# Patient Record
Sex: Female | Born: 1950 | Race: White | Hispanic: No | Smoking: Former smoker
Health system: Southern US, Community
[De-identification: ages and names within clinical notes are randomized; demographics above are authoritative.]

## PROBLEM LIST (undated history)

## (undated) DIAGNOSIS — F329 Major depressive disorder, single episode, unspecified: Secondary | ICD-10-CM

## (undated) DIAGNOSIS — R079 Chest pain, unspecified: Secondary | ICD-10-CM

## (undated) DIAGNOSIS — F32A Depression, unspecified: Secondary | ICD-10-CM

## (undated) DIAGNOSIS — J329 Chronic sinusitis, unspecified: Secondary | ICD-10-CM

## (undated) DIAGNOSIS — Z87442 Personal history of urinary calculi: Secondary | ICD-10-CM

## (undated) DIAGNOSIS — R519 Headache, unspecified: Secondary | ICD-10-CM

## (undated) DIAGNOSIS — C801 Malignant (primary) neoplasm, unspecified: Secondary | ICD-10-CM

## (undated) HISTORY — PX: BREAST BIOPSY: SHX20

## (undated) HISTORY — PX: ROTATOR CUFF REPAIR: SHX139

## (undated) HISTORY — DX: Chest pain, unspecified: R07.9

## (undated) HISTORY — PX: KNEE SURGERY: SHX244

---

## 2009-04-30 ENCOUNTER — Encounter: Admission: RE | Admit: 2009-04-30 | Discharge: 2009-04-30 | Payer: Self-pay | Admitting: Obstetrics and Gynecology

## 2013-03-14 ENCOUNTER — Ambulatory Visit: Payer: Self-pay | Admitting: Neurology

## 2016-04-30 DIAGNOSIS — J0141 Acute recurrent pansinusitis: Secondary | ICD-10-CM | POA: Diagnosis not present

## 2016-05-06 DIAGNOSIS — N760 Acute vaginitis: Secondary | ICD-10-CM | POA: Diagnosis not present

## 2016-05-06 DIAGNOSIS — R011 Cardiac murmur, unspecified: Secondary | ICD-10-CM | POA: Diagnosis not present

## 2016-05-06 DIAGNOSIS — J018 Other acute sinusitis: Secondary | ICD-10-CM | POA: Diagnosis not present

## 2016-05-12 DIAGNOSIS — R011 Cardiac murmur, unspecified: Secondary | ICD-10-CM | POA: Diagnosis not present

## 2016-05-17 ENCOUNTER — Encounter (HOSPITAL_COMMUNITY): Payer: Self-pay | Admitting: Emergency Medicine

## 2016-05-17 ENCOUNTER — Emergency Department (HOSPITAL_COMMUNITY)
Admission: EM | Admit: 2016-05-17 | Discharge: 2016-05-18 | Disposition: A | Discharge status: Home / Self Care | Payer: PPO | Attending: Emergency Medicine | Admitting: Emergency Medicine

## 2016-05-17 DIAGNOSIS — R1032 Left lower quadrant pain: Secondary | ICD-10-CM | POA: Insufficient documentation

## 2016-05-17 DIAGNOSIS — R1013 Epigastric pain: Secondary | ICD-10-CM | POA: Insufficient documentation

## 2016-05-17 HISTORY — DX: Chronic sinusitis, unspecified: J32.9

## 2016-05-17 HISTORY — DX: Malignant (primary) neoplasm, unspecified: C80.1

## 2016-05-17 HISTORY — DX: Depression, unspecified: F32.A

## 2016-05-17 HISTORY — DX: Major depressive disorder, single episode, unspecified: F32.9

## 2016-05-17 LAB — URINALYSIS, ROUTINE W REFLEX MICROSCOPIC
Bilirubin Urine: NEGATIVE
Glucose, UA: NEGATIVE mg/dL
Ketones, ur: NEGATIVE mg/dL
Leukocytes, UA: NEGATIVE
Nitrite: NEGATIVE
Protein, ur: NEGATIVE mg/dL
Specific Gravity, Urine: 1.024 (ref 1.005–1.030)
pH: 5 (ref 5.0–8.0)

## 2016-05-17 LAB — COMPREHENSIVE METABOLIC PANEL
ALT: 18 U/L (ref 14–54)
AST: 26 U/L (ref 15–41)
Albumin: 4.4 g/dL (ref 3.5–5.0)
Alkaline Phosphatase: 50 U/L (ref 38–126)
Anion gap: 10 (ref 5–15)
BUN: 15 mg/dL (ref 6–20)
CO2: 23 mmol/L (ref 22–32)
Calcium: 9.1 mg/dL (ref 8.9–10.3)
Chloride: 104 mmol/L (ref 101–111)
Creatinine, Ser: 1.02 mg/dL — ABNORMAL HIGH (ref 0.44–1.00)
GFR calc Af Amer: >60 mL/min (ref >60)
GFR calc non Af Amer: 56 mL/min — ABNORMAL LOW (ref >60)
Glucose, Bld: 142 mg/dL — ABNORMAL HIGH (ref 65–99)
Potassium: 4.7 mmol/L (ref 3.5–5.1)
Sodium: 137 mmol/L (ref 135–145)
Total Bilirubin: 0.6 mg/dL (ref 0.3–1.2)
Total Protein: 6.8 g/dL (ref 6.5–8.1)

## 2016-05-17 LAB — URINE MICROSCOPIC-ADD ON: Bacteria, UA: NONE SEEN

## 2016-05-17 LAB — CBC
HCT: 42.7 % (ref 36.0–46.0)
Hemoglobin: 14 g/dL (ref 12.0–15.0)
MCH: 31.5 pg (ref 26.0–34.0)
MCHC: 32.8 g/dL (ref 30.0–36.0)
MCV: 96 fL (ref 78.0–100.0)
Platelets: 218 10*3/uL (ref 150–400)
RBC: 4.45 MIL/uL (ref 3.87–5.11)
RDW: 12.6 % (ref 11.5–15.5)
WBC: 7.2 10*3/uL (ref 4.0–10.5)

## 2016-05-17 LAB — LIPASE, BLOOD: Lipase: 32 U/L (ref 11–51)

## 2016-05-17 MED ORDER — MORPHINE SULFATE (PF) 4 MG/ML IV SOLN
4.0000 mg | Freq: Once | INTRAVENOUS | Status: AC
  Administered 2016-05-18: 4 mg via INTRAVENOUS
  Filled 2016-05-17: qty 1

## 2016-05-17 MED ORDER — ONDANSETRON HCL 4 MG/2ML IJ SOLN
4.0000 mg | Freq: Once | INTRAMUSCULAR | Status: AC
  Administered 2016-05-18: 4 mg via INTRAVENOUS
  Filled 2016-05-17: qty 2

## 2016-05-17 MED ORDER — IOPAMIDOL (ISOVUE-300) INJECTION 61%
INTRAVENOUS | Status: AC
  Administered 2016-05-18: 100 mL
  Filled 2016-05-17: qty 100

## 2016-05-17 MED ORDER — ONDANSETRON 4 MG PO TBDP
4.0000 mg | ORAL_TABLET | Freq: Once | ORAL | Status: AC | PRN
  Administered 2016-05-17: 4 mg via ORAL

## 2016-05-17 MED ORDER — ONDANSETRON 4 MG PO TBDP
ORAL_TABLET | ORAL | Status: AC
  Filled 2016-05-17: qty 1

## 2016-05-17 NOTE — ED Notes (Signed)
EDP at bedside  

## 2016-05-17 NOTE — ED Triage Notes (Signed)
Pt. reports LLQ pain with emesis , chills and fatigue onset this evening , denies diarrhea or fever .

## 2016-05-18 ENCOUNTER — Encounter (HOSPITAL_COMMUNITY): Payer: Self-pay | Admitting: Radiology

## 2016-05-18 ENCOUNTER — Emergency Department (HOSPITAL_COMMUNITY): Payer: PPO

## 2016-05-18 DIAGNOSIS — R1032 Left lower quadrant pain: Secondary | ICD-10-CM | POA: Diagnosis not present

## 2016-05-18 MED ORDER — HYDROCODONE-ACETAMINOPHEN 5-325 MG PO TABS
1.0000 | ORAL_TABLET | Freq: Once | ORAL | Status: AC
  Administered 2016-05-18: 1 via ORAL
  Filled 2016-05-18: qty 1

## 2016-05-18 MED ORDER — ONDANSETRON 4 MG PO TBDP: ORAL_TABLET | ORAL | 0 refills | Status: DC

## 2016-05-18 MED ORDER — HYDROCODONE-ACETAMINOPHEN 5-325 MG PO TABS: 1.0000 | ORAL_TABLET | Freq: Four times a day (QID) | ORAL | 0 refills | Status: DC | PRN

## 2016-05-18 NOTE — ED Notes (Signed)
Pt back from CT

## 2016-05-18 NOTE — ED Notes (Signed)
EDP at bedside  

## 2016-05-18 NOTE — ED Provider Notes (Signed)
[facility] DEPT Provider Note   CSN: [id number] Arrival date & time: 05/17/16  2054     History   Chief Complaint Chief Complaint  Patient presents with  . Abdominal Pain  . Emesis    HPI Mindy Mann is a 65 y.o. female with a hx of depression, sinusitis presents to the Emergency Department complaining of gradual, persistent, rapidly progressively worsening Left lower quadrant abdominal pain onset 7 PM tonight. Associated symptoms include nausea and multiple episodes of nonbloody, nonbilious emesis. No treatments prior to arrival. No aggravating or alleviating factors. Patient also reports associated midline lower back pain. Patient denies fever, chills, diarrhea, numbness, tingling, weakness, loss of bowel or bladder control. She denies recent falls, anticoagulants or IV drug use. Patient denies recent travel.  The history is provided by the patient and medical records. No language interpreter was used.    Past Medical History:  Diagnosis Date  . Cancer ([place])   . Depression   . Sinusitis     There are no active problems to display for this patient.   Past Surgical History:  Procedure Laterality Date  . KNEE SURGERY    . ROTATOR CUFF REPAIR      OB History    No data available       Home Medications    Prior to Admission medications   Medication Sig Start Date End Date Taking? Authorizing Provider  HYDROcodone-acetaminophen (NORCO/VICODIN) 5-325 MG tablet Take 1-2 tablets by mouth every 6 (six) hours as needed for moderate pain or severe pain. 05/18/16   Joan Avetisyan, PA-C  ondansetron (ZOFRAN ODT) 4 MG disintegrating tablet 4mg  ODT q4 hours prn nausea/vomit 05/18/16   Jarrett Soho Lethia Donlon, PA-C    Family History No family history on file.  Social History Social History  Substance Use Topics  . Smoking status: Never Smoker  . Smokeless tobacco: Never Used  . Alcohol use Yes     Allergies   Patient has no known allergies.   Review of  Systems Review of Systems  Constitutional: Positive for diaphoresis ( resolved). Negative for fever.  Gastrointestinal: Positive for abdominal pain ( LLQ), nausea and vomiting.  Musculoskeletal: Positive for back pain ( low, midline). Negative for arthralgias and neck pain.  All other systems reviewed and are negative.    Physical Exam Updated Vital Signs BP 140/78   Pulse 69   Temp 97.6 F (36.4 C) (Rectal)   Resp 16   Ht 5' 4.25" (1.632 m)   Wt 56.2 kg   SpO2 99%   BMI 21.12 kg/m   Physical Exam  Constitutional: She appears well-developed and well-nourished. No distress.  Awake, alert, nontoxic appearance  HENT:  Head: Normocephalic and atraumatic.  Mouth/Throat: Oropharynx is clear and moist. No oropharyngeal exudate.  Eyes: Conjunctivae are normal. No scleral icterus.  Neck: Normal range of motion. Neck supple.  Full ROM without pain  Cardiovascular: Normal rate, regular rhythm and intact distal pulses.   Pulmonary/Chest: Effort normal and breath sounds normal. No respiratory distress. She has no wheezes.  Equal chest expansion  Abdominal: Soft. Bowel sounds are normal. She exhibits no distension and no mass. There is tenderness in the epigastric area and left lower quadrant. There is no rebound and no guarding.  Musculoskeletal: Normal range of motion. She exhibits no edema.  Full range of motion of the T-spine and L-spine No midline tenderness to the  T-spine or L-spine No Tenderness to palpation of the paraspinous muscles of the L-spine  Lymphadenopathy:  She has no cervical adenopathy.  Neurological: She is alert.  Speech is clear and goal oriented, follows commands Normal 5/5 strength in upper and lower extremities bilaterally including dorsiflexion and plantar flexion, strong and equal grip strength Sensation normal to light touch Moves extremities without ataxia, coordination intact Normal gait Normal balance No Clonus  Skin: Skin is warm and dry. No rash  noted. She is not diaphoretic. No erythema.  Psychiatric: She has a normal mood and affect. Her behavior is normal.  Nursing note and vitals reviewed.    ED Treatments / Results  Labs (all labs ordered are listed, but only abnormal results are displayed) Labs Reviewed  COMPREHENSIVE METABOLIC PANEL - Abnormal; Notable for the following:       Result Value   Glucose, Bld 142 (*)    Creatinine, Ser 1.02 (*)    GFR calc non Af Amer 56 (*)    All other components within normal limits  URINALYSIS, ROUTINE W REFLEX MICROSCOPIC (NOT AT [facility]) - Abnormal; Notable for the following:    APPearance HAZY (*)    Hgb urine dipstick LARGE (*)    All other components within normal limits  URINE MICROSCOPIC-ADD ON - Abnormal; Notable for the following:    Squamous Epithelial / LPF 0-5 (*)    Crystals CA OXALATE CRYSTALS (*)    All other components within normal limits  LIPASE, BLOOD  CBC     Radiology Ct Abdomen Pelvis W Contrast  Result Date: 05/18/2016 CLINICAL DATA:  Left lower quadrant abdominal pain and low back pain for 1 day. Nausea and vomiting. EXAM: CT ABDOMEN AND PELVIS WITH CONTRAST TECHNIQUE: Multidetector CT imaging of the abdomen and pelvis was performed using the standard protocol following bolus administration of intravenous contrast. CONTRAST:  100 ml ISOVUE-300 IOPAMIDOL (ISOVUE-300) INJECTION 61% COMPARISON:  None. FINDINGS: Lower chest: The lung bases are clear. Hepatobiliary: No focal liver abnormality is seen. No gallstones, gallbladder wall thickening, or biliary dilatation. Pancreas: Unremarkable. No pancreatic ductal dilatation or surrounding inflammatory changes. Spleen: Normal in size without focal abnormality. Adrenals/Urinary Tract: No adrenal gland nodules. Nonobstructing stone in the midpole left kidney. Infiltration or edema around the lower pole left kidney and around the proximal ureter with ureteral wall thickening. This may indicate pyelonephritis. No obstructing  stones are demonstrated but changes could reflect sequela of a recently passed stone. Renal nephrograms are symmetrical. No hydronephrosis or hydroureter. Bladder wall is not thickened. Stomach/Bowel: Stomach is mostly decompressed. Small bowel are decompressed. Diffusely stool-filled colon. No wall thickening or inflammatory changes appreciated. Appendix is not identified. Vascular/Lymphatic: No significant vascular findings are present. No enlarged abdominal or pelvic lymph nodes. Reproductive: Calcifications in the uterus consistent with fibroids. No abnormal adnexal masses. Uterus is atrophic. Other: No abdominal wall hernia or abnormality. No abdominopelvic ascites. Musculoskeletal: Degenerative changes in the spine. No destructive bone lesions. IMPRESSION: No evidence of bowel obstruction or inflammation. Diffusely stool-filled colon. Nonobstructing stone in the left kidney. There is infiltration or edema around the left kidney and proximal ureter. Mild ureteral wall thickening. This may indicate pyelonephritis or sequela of a recently passed stone. Electronically Signed   By: Lucienne Capers M.D.   On: 05/18/2016 00:54    Procedures Procedures (including critical care time)  EMERGENCY DEPARTMENT [place] ABD/AORTA EXAM Study: Limited Ultrasound of the Abdominal Aorta.  INDICATIONS:Abdominal pain Indication: Multiple views of the abdominal aorta are obtained from the diaphragmatic hiatus to the aortic bifurcation in transverse and sagittal planes with a multi- Frequency probe.  PERFORMED BY: Myself  IMAGES ARCHIVED?: Yes  FINDINGS: Maximum aortic dimensions are 2.03  LIMITATIONS:  Bowel gas  INTERPRETATION:  No abdominal aortic aneurysm  COMMENT:  No free fluid, no AAA    EMERGENCY DEPARTMENT US RENAL EXAM  "Study: Limited Retroperitoneal Ultrasound of Kidneys"  INDICATIONS: Back pain  Long and short axis of both kidneys were obtained.   PERFORMED BY: Myself  IMAGES ARCHIVED?:  Yes  LIMITATIONS: Body habitus  VIEWS USED: Long axis   INTERPRETATION: No Hydronephrosis   CPT Code: [phone] (limited retroperitoneal)    Medications Ordered in ED Medications  ondansetron (ZOFRAN-ODT) disintegrating tablet 4 mg (4 mg Oral Given 05/17/16 2144)  morphine 4 MG/ML injection 4 mg (4 mg Intravenous Given 05/18/16 0002)  ondansetron (ZOFRAN) injection 4 mg (4 mg Intravenous Given 05/18/16 0002)  iopamidol (ISOVUE-300) 61 % injection (100 mLs  Contrast Given 05/18/16 0018)  HYDROcodone-acetaminophen (NORCO/VICODIN) 5-325 MG per tablet 1 tablet (1 tablet Oral Given 05/18/16 0134)     Initial Impression / Assessment and Plan / ED Course  I have reviewed the triage vital signs and the nursing notes.  Pertinent labs & imaging results that were available during my care of the patient were reviewed by me and considered in my medical decision making (see chart for details).  Clinical Course as of May 18 204  Tue May 18, 2016  0203 Crystals but no signs of infection. Crystals: (!) CA OXALATE CRYSTALS [HM]  0203 Normal WBC: 7.2 [HM]  0203 Normal Lipase: 32 [HM]  0203 Infiltration or edema around the left kidney and proximal ureter with mild ureteral wall thickening.  No evidence of pyelonephritis based on clinical exam and urinalysis. Suspect recently passed stone. Patient's pain has improved significantly since arrival in the department.   CT ABDOMEN PELVIS W CONTRAST [HM]  0204 Patient is tolerating by mouth without difficulty. Pain is controlled. No persistent vomiting. No fevers or chills. Vital signs stable.  [HM]    Clinical Course User Index [HM] Abigail Butts, PA-C    Patient with left lower quadrant abdominal pain. Low back pain but no CVA tenderness. No evidence of urinary tract infection on UA. Patient with minimal hemoglobin and calcium oxalate crystals. Suspect possible ureteral stone/renal colic.   CT scan with evidence of pyelonephritis versus  recently passed ureteral stone. There is no clinical or laboratory evidence of pyelonephritis. She is afebrile and well-appearing. She is now tolerating by mouth without difficulty. Suspect patient recently passed a ureteral stone. She has a persistent stone in her left kidney though I doubt this the source of her pain.   Findings discussed. Discussed planned for discharge home and close primary care follow-up. Patient states understanding and is agreeable to plan. Her pain is well controlled at this time.   BP 124/75   Pulse 81   Temp 97.6 F (36.4 C) (Rectal)   Resp 18   Ht 5' 4.25" (1.632 m)   Wt 56.2 kg   SpO2 96%   BMI 21.12 kg/m   Final Clinical Impressions(s) / ED Diagnoses   Final diagnoses:  LLQ abdominal pain    New Prescriptions New Prescriptions   HYDROCODONE-ACETAMINOPHEN (NORCO/VICODIN) 5-325 MG TABLET    Take 1-2 tablets by mouth every 6 (six) hours as needed for moderate pain or severe pain.   ONDANSETRON (ZOFRAN ODT) 4 MG DISINTEGRATING TABLET    4mg  ODT q4 hours prn nausea/vomit     Abigail Butts, PA-C 05/18/16 0207    Merryl Hacker,  MD 05/18/16 [id number]

## 2016-05-18 NOTE — Discharge Instructions (Signed)
1. Medications: zofran, vicodin, usual home medications 2. Treatment: rest, drink plenty of fluids, advance diet slowly 3. Follow Up: Please followup with your primary doctor in 2-3 days for discussion of your diagnoses and further evaluation after today's visit; if you do not have a primary care doctor use the resource guide provided to find one; Please return to the ER for persistent vomiting, high fevers or worsening symptoms

## 2016-05-18 NOTE — ED Notes (Signed)
Pt given Coke for PO challenge, currently tolerating well. Will continue to monitor

## 2016-05-18 NOTE — ED Notes (Signed)
Patient transported to CT 

## 2016-05-20 DIAGNOSIS — N390 Urinary tract infection, site not specified: Secondary | ICD-10-CM | POA: Diagnosis not present

## 2016-05-20 DIAGNOSIS — N200 Calculus of kidney: Secondary | ICD-10-CM | POA: Diagnosis not present

## 2016-05-26 DIAGNOSIS — I071 Rheumatic tricuspid insufficiency: Secondary | ICD-10-CM | POA: Diagnosis not present

## 2016-05-26 DIAGNOSIS — R011 Cardiac murmur, unspecified: Secondary | ICD-10-CM | POA: Diagnosis not present

## 2016-05-26 DIAGNOSIS — I340 Nonrheumatic mitral (valve) insufficiency: Secondary | ICD-10-CM | POA: Diagnosis not present

## 2016-05-26 DIAGNOSIS — I10 Essential (primary) hypertension: Secondary | ICD-10-CM | POA: Diagnosis not present

## 2016-08-17 DIAGNOSIS — L821 Other seborrheic keratosis: Secondary | ICD-10-CM | POA: Diagnosis not present

## 2016-08-17 DIAGNOSIS — D1800 Hemangioma unspecified site: Secondary | ICD-10-CM | POA: Diagnosis not present

## 2016-08-17 DIAGNOSIS — Z23 Encounter for immunization: Secondary | ICD-10-CM | POA: Diagnosis not present

## 2016-08-17 DIAGNOSIS — D225 Melanocytic nevi of trunk: Secondary | ICD-10-CM | POA: Diagnosis not present

## 2016-08-17 DIAGNOSIS — Z808 Family history of malignant neoplasm of other organs or systems: Secondary | ICD-10-CM | POA: Diagnosis not present

## 2016-08-17 DIAGNOSIS — Z85828 Personal history of other malignant neoplasm of skin: Secondary | ICD-10-CM | POA: Diagnosis not present

## 2016-08-17 DIAGNOSIS — D2271 Melanocytic nevi of right lower limb, including hip: Secondary | ICD-10-CM | POA: Diagnosis not present

## 2016-08-17 DIAGNOSIS — L814 Other melanin hyperpigmentation: Secondary | ICD-10-CM | POA: Diagnosis not present

## 2016-09-29 DIAGNOSIS — Z1231 Encounter for screening mammogram for malignant neoplasm of breast: Secondary | ICD-10-CM | POA: Diagnosis not present

## 2016-09-29 DIAGNOSIS — N952 Postmenopausal atrophic vaginitis: Secondary | ICD-10-CM | POA: Diagnosis not present

## 2016-09-29 DIAGNOSIS — Z6821 Body mass index (BMI) 21.0-21.9, adult: Secondary | ICD-10-CM | POA: Diagnosis not present

## 2016-09-29 DIAGNOSIS — R350 Frequency of micturition: Secondary | ICD-10-CM | POA: Diagnosis not present

## 2016-09-29 DIAGNOSIS — Z01419 Encounter for gynecological examination (general) (routine) without abnormal findings: Secondary | ICD-10-CM | POA: Diagnosis not present

## 2016-09-29 DIAGNOSIS — R319 Hematuria, unspecified: Secondary | ICD-10-CM | POA: Diagnosis not present

## 2016-09-29 DIAGNOSIS — Z124 Encounter for screening for malignant neoplasm of cervix: Secondary | ICD-10-CM | POA: Diagnosis not present

## 2016-10-12 DIAGNOSIS — N870 Mild cervical dysplasia: Secondary | ICD-10-CM | POA: Diagnosis not present

## 2016-10-12 DIAGNOSIS — R87612 Low grade squamous intraepithelial lesion on cytologic smear of cervix (LGSIL): Secondary | ICD-10-CM | POA: Diagnosis not present

## 2016-10-19 DIAGNOSIS — R319 Hematuria, unspecified: Secondary | ICD-10-CM | POA: Diagnosis not present

## 2016-11-02 DIAGNOSIS — N888 Other specified noninflammatory disorders of cervix uteri: Secondary | ICD-10-CM | POA: Diagnosis not present

## 2016-11-02 DIAGNOSIS — R87610 Atypical squamous cells of undetermined significance on cytologic smear of cervix (ASC-US): Secondary | ICD-10-CM | POA: Diagnosis not present

## 2017-03-23 DIAGNOSIS — H2513 Age-related nuclear cataract, bilateral: Secondary | ICD-10-CM | POA: Diagnosis not present

## 2017-03-23 DIAGNOSIS — H524 Presbyopia: Secondary | ICD-10-CM | POA: Diagnosis not present

## 2017-03-23 DIAGNOSIS — H52203 Unspecified astigmatism, bilateral: Secondary | ICD-10-CM | POA: Diagnosis not present

## 2017-03-23 DIAGNOSIS — H04123 Dry eye syndrome of bilateral lacrimal glands: Secondary | ICD-10-CM | POA: Diagnosis not present

## 2017-03-23 DIAGNOSIS — H43391 Other vitreous opacities, right eye: Secondary | ICD-10-CM | POA: Diagnosis not present

## 2017-04-25 DIAGNOSIS — Z779 Other contact with and (suspected) exposures hazardous to health: Secondary | ICD-10-CM | POA: Diagnosis not present

## 2017-04-25 DIAGNOSIS — N390 Urinary tract infection, site not specified: Secondary | ICD-10-CM | POA: Diagnosis not present

## 2017-04-25 DIAGNOSIS — N879 Dysplasia of cervix uteri, unspecified: Secondary | ICD-10-CM | POA: Diagnosis not present

## 2017-04-25 DIAGNOSIS — R351 Nocturia: Secondary | ICD-10-CM | POA: Diagnosis not present

## 2017-05-11 DIAGNOSIS — D259 Leiomyoma of uterus, unspecified: Secondary | ICD-10-CM | POA: Diagnosis not present

## 2017-07-20 DIAGNOSIS — J018 Other acute sinusitis: Secondary | ICD-10-CM | POA: Diagnosis not present

## 2017-08-23 DIAGNOSIS — D692 Other nonthrombocytopenic purpura: Secondary | ICD-10-CM | POA: Diagnosis not present

## 2017-08-23 DIAGNOSIS — D2271 Melanocytic nevi of right lower limb, including hip: Secondary | ICD-10-CM | POA: Diagnosis not present

## 2017-08-23 DIAGNOSIS — L814 Other melanin hyperpigmentation: Secondary | ICD-10-CM | POA: Diagnosis not present

## 2017-08-23 DIAGNOSIS — D1800 Hemangioma unspecified site: Secondary | ICD-10-CM | POA: Diagnosis not present

## 2017-08-23 DIAGNOSIS — Z808 Family history of malignant neoplasm of other organs or systems: Secondary | ICD-10-CM | POA: Diagnosis not present

## 2017-08-23 DIAGNOSIS — D225 Melanocytic nevi of trunk: Secondary | ICD-10-CM | POA: Diagnosis not present

## 2017-08-23 DIAGNOSIS — Z85828 Personal history of other malignant neoplasm of skin: Secondary | ICD-10-CM | POA: Diagnosis not present

## 2017-08-23 DIAGNOSIS — Z23 Encounter for immunization: Secondary | ICD-10-CM | POA: Diagnosis not present

## 2017-08-23 DIAGNOSIS — L821 Other seborrheic keratosis: Secondary | ICD-10-CM | POA: Diagnosis not present

## 2017-10-01 DIAGNOSIS — M545 Low back pain: Secondary | ICD-10-CM | POA: Diagnosis not present

## 2017-10-01 DIAGNOSIS — M62830 Muscle spasm of back: Secondary | ICD-10-CM | POA: Diagnosis not present

## 2017-11-23 DIAGNOSIS — Z6827 Body mass index (BMI) 27.0-27.9, adult: Secondary | ICD-10-CM | POA: Diagnosis not present

## 2017-11-23 DIAGNOSIS — Z124 Encounter for screening for malignant neoplasm of cervix: Secondary | ICD-10-CM | POA: Diagnosis not present

## 2017-11-23 DIAGNOSIS — Z1231 Encounter for screening mammogram for malignant neoplasm of breast: Secondary | ICD-10-CM | POA: Diagnosis not present

## 2017-11-23 DIAGNOSIS — N3945 Continuous leakage: Secondary | ICD-10-CM | POA: Diagnosis not present

## 2017-11-23 DIAGNOSIS — Z779 Other contact with and (suspected) exposures hazardous to health: Secondary | ICD-10-CM | POA: Diagnosis not present

## 2017-11-23 DIAGNOSIS — M810 Age-related osteoporosis without current pathological fracture: Secondary | ICD-10-CM | POA: Diagnosis not present

## 2017-11-23 DIAGNOSIS — Z780 Asymptomatic menopausal state: Secondary | ICD-10-CM | POA: Diagnosis not present

## 2017-11-23 DIAGNOSIS — Z01419 Encounter for gynecological examination (general) (routine) without abnormal findings: Secondary | ICD-10-CM | POA: Diagnosis not present

## 2017-11-23 DIAGNOSIS — N958 Other specified menopausal and perimenopausal disorders: Secondary | ICD-10-CM | POA: Diagnosis not present

## 2018-01-30 DIAGNOSIS — Z23 Encounter for immunization: Secondary | ICD-10-CM | POA: Diagnosis not present

## 2018-01-30 DIAGNOSIS — Z13220 Encounter for screening for lipoid disorders: Secondary | ICD-10-CM | POA: Diagnosis not present

## 2018-01-30 DIAGNOSIS — F322 Major depressive disorder, single episode, severe without psychotic features: Secondary | ICD-10-CM | POA: Diagnosis not present

## 2018-01-30 DIAGNOSIS — M818 Other osteoporosis without current pathological fracture: Secondary | ICD-10-CM | POA: Diagnosis not present

## 2018-01-30 DIAGNOSIS — Z79899 Other long term (current) drug therapy: Secondary | ICD-10-CM | POA: Diagnosis not present

## 2018-01-30 DIAGNOSIS — M542 Cervicalgia: Secondary | ICD-10-CM | POA: Diagnosis not present

## 2018-02-15 DIAGNOSIS — M810 Age-related osteoporosis without current pathological fracture: Secondary | ICD-10-CM | POA: Diagnosis not present

## 2018-03-01 DIAGNOSIS — J018 Other acute sinusitis: Secondary | ICD-10-CM | POA: Diagnosis not present

## 2018-03-28 DIAGNOSIS — H52203 Unspecified astigmatism, bilateral: Secondary | ICD-10-CM | POA: Diagnosis not present

## 2018-03-28 DIAGNOSIS — H04123 Dry eye syndrome of bilateral lacrimal glands: Secondary | ICD-10-CM | POA: Diagnosis not present

## 2018-03-28 DIAGNOSIS — H2513 Age-related nuclear cataract, bilateral: Secondary | ICD-10-CM | POA: Diagnosis not present

## 2018-03-28 DIAGNOSIS — H524 Presbyopia: Secondary | ICD-10-CM | POA: Diagnosis not present

## 2018-03-28 DIAGNOSIS — H5203 Hypermetropia, bilateral: Secondary | ICD-10-CM | POA: Diagnosis not present

## 2018-04-07 DIAGNOSIS — M25512 Pain in left shoulder: Secondary | ICD-10-CM | POA: Diagnosis not present

## 2018-04-07 DIAGNOSIS — M542 Cervicalgia: Secondary | ICD-10-CM | POA: Diagnosis not present

## 2018-04-11 DIAGNOSIS — H903 Sensorineural hearing loss, bilateral: Secondary | ICD-10-CM | POA: Diagnosis not present

## 2018-04-26 DIAGNOSIS — H903 Sensorineural hearing loss, bilateral: Secondary | ICD-10-CM | POA: Diagnosis not present

## 2018-06-19 DIAGNOSIS — Z01419 Encounter for gynecological examination (general) (routine) without abnormal findings: Secondary | ICD-10-CM | POA: Diagnosis not present

## 2018-06-19 DIAGNOSIS — R87610 Atypical squamous cells of undetermined significance on cytologic smear of cervix (ASC-US): Secondary | ICD-10-CM | POA: Diagnosis not present

## 2018-07-16 DIAGNOSIS — T3695XA Adverse effect of unspecified systemic antibiotic, initial encounter: Secondary | ICD-10-CM | POA: Diagnosis not present

## 2018-07-16 DIAGNOSIS — J018 Other acute sinusitis: Secondary | ICD-10-CM | POA: Diagnosis not present

## 2018-07-16 DIAGNOSIS — B379 Candidiasis, unspecified: Secondary | ICD-10-CM | POA: Diagnosis not present

## 2018-07-16 DIAGNOSIS — Z6822 Body mass index (BMI) 22.0-22.9, adult: Secondary | ICD-10-CM | POA: Diagnosis not present

## 2018-08-23 ENCOUNTER — Encounter: Payer: Self-pay | Admitting: Cardiology

## 2018-09-05 DIAGNOSIS — N3000 Acute cystitis without hematuria: Secondary | ICD-10-CM | POA: Diagnosis not present

## 2018-09-06 ENCOUNTER — Encounter: Payer: Self-pay | Admitting: Cardiology

## 2018-09-06 ENCOUNTER — Telehealth: Payer: Self-pay | Admitting: Cardiology

## 2018-09-06 NOTE — Telephone Encounter (Signed)
Called patient to reschedule her appointment on 09/06/2018 secondary to the coronavirus pandemic.  Patient was referred by her GYN for atypical chest pain.  She tells me that around the second or third week of January she developed a flu with flulike symptoms along with chest tightness about 1 to 2 weeks later the flu symptoms resolved but occasionally she will still get an ache in her chest not really associated with exertion.  She says right now she is feeling fine and is fine to reschedule her appointment.  I have told her that if her symptoms change she needs to call our office immediately.  Please set her up for a follow-up appointment with me in 3 weeks.

## 2018-09-07 ENCOUNTER — Ambulatory Visit: Payer: PPO | Admitting: Cardiology

## 2018-09-07 DIAGNOSIS — R079 Chest pain, unspecified: Secondary | ICD-10-CM | POA: Diagnosis not present

## 2018-09-07 DIAGNOSIS — Z86010 Personal history of colonic polyps: Secondary | ICD-10-CM | POA: Diagnosis not present

## 2018-09-07 DIAGNOSIS — R140 Abdominal distension (gaseous): Secondary | ICD-10-CM | POA: Diagnosis not present

## 2018-09-07 DIAGNOSIS — R194 Change in bowel habit: Secondary | ICD-10-CM | POA: Diagnosis not present

## 2018-09-07 NOTE — Telephone Encounter (Signed)
The patient, called and stated that she went to another doctor's office and they said her pressure was 191J systolic, she was getting worried about delaying her appointment. She requested to be seen soon.

## 2018-09-07 NOTE — Telephone Encounter (Signed)
Please tell her that I do not recommend office visit at this time.  Please have her check her BPs daily for a week and call with the results.  A SBP of 119mmHg for the meantime is ok

## 2018-09-08 NOTE — Telephone Encounter (Signed)
Follow up  ° ° °Patient states that she is returning your call. ° ° °

## 2018-09-08 NOTE — Telephone Encounter (Signed)
Spoke with the patient, she expressed understanding.  

## 2018-09-08 NOTE — Telephone Encounter (Signed)
The patient, thought I left her a new message. I advised her it was an older message. She expressed understanding and had no further questions.

## 2018-09-08 NOTE — Telephone Encounter (Signed)
Attempted, patient unavailable.

## 2018-10-03 NOTE — Telephone Encounter (Signed)
Please follow up with patient to check BP's per Dr Landis Gandy request.

## 2018-10-11 NOTE — Telephone Encounter (Signed)
Spoke with the patient, she did not take her blood pressure, she stated she would go to a store to have it checked. I also advised her on a virtual visit she stated she would try and download Webex and would call the office on Thursday to discuss.

## 2018-10-11 NOTE — Telephone Encounter (Signed)
Left a message to call back.

## 2018-10-12 ENCOUNTER — Encounter: Payer: Self-pay | Admitting: Cardiology

## 2018-10-12 ENCOUNTER — Ambulatory Visit: Payer: PPO | Admitting: Cardiology

## 2018-10-12 NOTE — Telephone Encounter (Signed)
Duplicate

## 2018-10-12 NOTE — Telephone Encounter (Signed)
New Message             Patient is returning Ben's call, would like a call back.

## 2018-10-12 NOTE — Telephone Encounter (Signed)
Spoke with the patient, her blood pressure was 121/77 today when she took it. She also scheduled a visit with Dr. Radford Pax to discuss her chest pain.

## 2018-10-12 NOTE — Telephone Encounter (Signed)

## 2018-10-15 DIAGNOSIS — R0789 Other chest pain: Principal | ICD-10-CM

## 2018-10-15 DIAGNOSIS — R079 Chest pain, unspecified: Secondary | ICD-10-CM | POA: Insufficient documentation

## 2018-10-15 NOTE — Progress Notes (Signed)
This encounter was created in error - please disregard.

## 2018-10-16 ENCOUNTER — Encounter: Payer: Self-pay | Admitting: Cardiology

## 2018-10-16 ENCOUNTER — Encounter: Payer: PPO | Admitting: Cardiology

## 2018-10-16 ENCOUNTER — Other Ambulatory Visit: Payer: Self-pay

## 2018-10-16 VITALS — BP 120/74 | Ht 64.25 in | Wt 129.0 lb

## 2018-10-17 NOTE — Progress Notes (Signed)
Virtual Visit via Video Note   This visit type was conducted due to national recommendations for restrictions regarding the COVID-19 Pandemic (e.g. social distancing) in an effort to limit this patient's exposure and mitigate transmission in our community.  Due to her co-morbid illnesses, this patient is at least at moderate risk for complications without adequate follow up.  This format is felt to be most appropriate for this patient at this time.  All issues noted in this document were discussed and addressed.  A limited physical exam was performed with this format.  Please refer to the patient's chart for her consent to telehealth for [facility].   Evaluation Performed:  Cardiology consult for Chest pain  This visit type was conducted due to national recommendations for restrictions regarding the COVID-19 Pandemic (e.g. social distancing).  This format is felt to be most appropriate for this patient at this time.  All issues noted in this document were discussed and addressed.  No physical exam was performed (except for noted visual exam findings with Video Visits).  Please refer to the patient's chart (MyChart message for video visits and phone note for telephone visits) for the patient's consent to telehealth for [facility].  Date:  10/19/2018   ID:  Mindy Mann, DOB December 24, 1950, MRN [id number]  Patient Location:  [place]  Provider location:   [place]  PCP:  Jonathon Jordan, MD  Cardiologist:  NEW Electrophysiologist:  None   Chief Complaint:  Atypical chest pain  History of Present Illness:    Mindy Mann is a 68 y.o. female who presents via Engineer, civil (consulting) for a telehealth visit today referred by Jonathon Jordan MD and GYN for evaluation of atypical chest pain.    This is a very pleasant 68 year old female with a history of depression who developed flulike symptoms around the second or third week of January along with chest tightness.  She tells me  that she had gone down to stay with a friend in [place] in mid January after her friend lost her husband.  Around the same time there was a lots of friends that came by and they all went out together and the next day 1 of the people in the party got sick and then a few days later she got sick.  Apparently the flu symptoms resolved within 1 to 2 weeks but she continued to have an achiness in her chest that was not associated with exertion.  She tells me that the pain has been constant and midsternal with no radiation.  It never goes away.  She does dance classes in [place] and she says after students [place] weight.  Sometimes she will get shortness of breath but it mainly as a sensation that she feels she cannot take a deep breath but if she yawns she feels like she gets enough air.  She says occasionally her arms and feet will feel tingly when she is walking or dancing and she will have to rub her arms.  She denies any episodes of diaphoresis or nausea when she is dancing or exerting herself but will occasionally notice shortness of breath.  She does have a history of tobacco use but quit 11 to 12 years ago.  She has a family history of CAD with her father dying of an MI at 75 but he was a heavy smoker.  The patient does not have symptoms concerning for COVID-19 infection (fever, chills, cough, or new shortness of breath).    Prior CV studies:  The following studies were reviewed today:  none  Past Medical History:  Diagnosis Date   Cancer ([place])    Chest pain    Depression    Sinusitis    Past Surgical History:  Procedure Laterality Date   KNEE SURGERY     ROTATOR CUFF REPAIR       Current Meds  Medication Sig   aspirin EC 81 MG tablet Take 81 mg by mouth daily.   cycloSPORINE (RESTASIS) 0.05 % ophthalmic emulsion 1 drop 2 (two) times daily.   rizatriptan (MAXALT) 10 MG tablet Take 10 mg by mouth as needed for migraine. May repeat in 2 hours if needed   sertraline (ZOLOFT) 50  MG tablet Take 50 mg by mouth daily.     Allergies:   Patient has no known allergies.   Social History   Tobacco Use   Smoking status: Never Smoker   Smokeless tobacco: Never Used  Substance Use Topics   Alcohol use: Yes   Drug use: No     Family Hx: The patient's family history includes Heart attack (age of onset: 45) in her father; Heart murmur in her sister.  ROS:   Please see the history of present illness.     All other systems reviewed and are negative.   Labs/Other Tests and Data Reviewed:    Recent Labs: No results found for requested labs within last 8760 hours.   Recent Lipid Panel No results found for: CHOL, TRIG, HDL, CHOLHDL, LDLCALC, LDLDIRECT  Wt Readings from Last 3 Encounters:  10/16/18 129 lb (58.5 kg)  05/17/16 124 lb (56.2 kg)     Objective:    Vital Signs:  BP 120/74    Ht 5' 4.25" (1.632 m)    Wt 129 lb (58.5 kg)    BMI 21.97 kg/m    CONSTITUTIONAL:  Well nourished, well developed female in no acute distress.  EYES: anicteric MOUTH: oral mucosa is pink RESPIRATORY: Normal respiratory effort, symmetric expansion CARDIOVASCULAR: No peripheral edema SKIN: No rash, lesions or ulcers MUSCULOSKELETAL: no digital cyanosis NEURO: Cranial Nerves II-XII grossly intact, moves all extremities PSYCH: Intact judgement and insight.  A&O x 3, Mood/affect appropriate   ASSESSMENT & PLAN:    1.  Chest pain -her chest pain is atypical in that it has been constant since January never going completely away.  There is no radiation of the pain and there is no associated symptoms of nausea or diaphoresis.  She does get some night sweats related to menopause.  She is active in dancing lessons and really does not notice any significant difference when dancing except for maybe some mild shortness of breath but the chest discomfort does not get worse.  She does notice some tingling in her arms and her legs after she dances.  She also describes the shortness of  breath she have is more as she cannot take a deep breath then.  I recommended that we get a 2D echocardiogram to assess LV function and rule out valvular heart disease since I cannot perform full cardiac exam on her due to the video visit in the setting of COVID-19.  I am also going to get a chest CT for calcium score.  If her calcium score is high then we will consider coronary CTA to rule out significant CAD.  2.  COVID-19 Education:The signs and symptoms of COVID-19 were discussed with the patient and how to seek care for testing (follow up with PCP or arrange E-visit).  The  importance of social distancing was discussed today.  Patient Risk:   After full review of this patient's clinical status, I feel that they are at least moderate risk at this time.  Time:   Today, I have spent 30 minutes directly with the patient on video discussing medical problems including chest pain.  We also reviewed the symptoms of COVID 19 and the ways to protect against contracting the virus with telehealth technology.  I spent an additional 5 minutes reviewing patient's chart including office notes from PCP.  Medication Adjustments/Labs and Tests Ordered: Current medicines are reviewed at length with the patient today.  Concerns regarding medicines are outlined above.  Tests Ordered: No orders of the defined types were placed in this encounter.  Medication Changes: No orders of the defined types were placed in this encounter.   Disposition:  Follow up prn  Signed, Fransico Him, MD  10/19/2018    [facility]

## 2018-10-19 ENCOUNTER — Encounter: Payer: Self-pay | Admitting: Cardiology

## 2018-10-19 ENCOUNTER — Telehealth (INDEPENDENT_AMBULATORY_CARE_PROVIDER_SITE_OTHER): Payer: PPO | Admitting: Cardiology

## 2018-10-19 ENCOUNTER — Other Ambulatory Visit: Payer: Self-pay

## 2018-10-19 VITALS — BP 116/80 | HR 65 | Ht 64.25 in | Wt 128.0 lb

## 2018-10-19 DIAGNOSIS — R079 Chest pain, unspecified: Secondary | ICD-10-CM

## 2018-10-19 DIAGNOSIS — R0789 Other chest pain: Secondary | ICD-10-CM

## 2018-10-19 NOTE — Patient Instructions (Signed)
Medication Instructions:  Your physician recommends that you continue on your current medications as directed. Please refer to the Current Medication list given to you today.  If you need a refill on your cardiac medications before your next appointment, please call your pharmacy.   Lab work: None If you have labs (blood work) drawn today and your tests are completely normal, you will receive your results only by: Marland Kitchen MyChart Message (if you have MyChart) OR . A paper copy in the mail If you have any lab test that is abnormal or we need to change your treatment, we will call you to review the results.  Testing/Procedures: Your physician has requested that you have an echocardiogram. Echocardiography is a painless test that uses sound waves to create images of your heart. It provides your doctor with information about the size and shape of your heart and how well your heart's chambers and valves are working. This procedure takes approximately one hour. There are no restrictions for this procedure.  A Calcium Score.   Follow-Up: As needed, pending results.

## 2018-11-14 ENCOUNTER — Telehealth: Payer: Self-pay | Admitting: *Deleted

## 2018-11-14 ENCOUNTER — Telehealth (HOSPITAL_COMMUNITY): Payer: Self-pay | Admitting: Radiology

## 2018-11-14 NOTE — Telephone Encounter (Signed)
Script Screening patients for COVID-19 and reviewing new operational procedures  Greeting - The reason I am calling is to share with you some new changes to our processes that are designed to help [place] keep everyone safe. Is now a good time to speak with you? Patient says "no' - ask them when you can call back and let them know it's important to do this prior to their appointment.  Patient says "yes" - Mindy Mann, Mindy Mann  the first thing I need to do is ask you some screening Questions.  1. To the best of your knowledge, have you been in close contact with any one with a confirmed diagnosis of COVID 19? o No - proceed to next question  2. Have you had any one or more of the following: fever, chills, cough, shortness of breath or any flu-like symptoms? o No - proceed to next question  3. Have you been diagnosed with or have a previous diagnosis of COVID 19? o No - proceed to next question  4. I am going to go over a few other symptoms with you. Please let me know if you are experiencing any of the following: . Ear, nose or throat discomfort . A sore throat . Headache . Muscle pain . Diarrhea . Loss of taste or smell o No - proceed to next question   Thank you for answering these questions. Please know we will ask you these questions or similar questions when you arrive for your appointment and again it's how we are keeping everyone safe. Also, to keep you safe, please use the provided hand sanitizer when you enter the building. Mindy Mann we are asking everyone in the building to wear a mask because they help [place] prevent the spread of germs. Do you have a mask of your own, if not, we are happy to provide one for you. The last thing I want to go over with you is the no visitor guidelines. This means no one can attend the appointment with you unless you need physical assistance. I understand this may be different from your past appointments and I know this may be difficult but  please know if someone is driving you we are happy to call them for you once your appointment is over.  [facility] SITE SPECIFIC CHECK IN [place]  Mindy Mann given you a lot of information, what questions do you have about what I've talked about today or your appointment tomorrow?

## 2018-11-14 NOTE — Telephone Encounter (Signed)
Unable to leave message no DPR.-Needed to give instructions for echocardiogram.

## 2018-11-15 ENCOUNTER — Ambulatory Visit (HOSPITAL_COMMUNITY): Payer: PPO | Attending: Internal Medicine

## 2018-11-15 ENCOUNTER — Other Ambulatory Visit: Payer: Self-pay

## 2018-11-15 ENCOUNTER — Ambulatory Visit (INDEPENDENT_AMBULATORY_CARE_PROVIDER_SITE_OTHER)
Admission: RE | Admit: 2018-11-15 | Discharge: 2018-11-15 | Disposition: A | Discharge status: Home / Self Care | Payer: PPO | Source: Ambulatory Visit | Attending: Cardiology | Admitting: Cardiology

## 2018-11-15 DIAGNOSIS — R0789 Other chest pain: Secondary | ICD-10-CM

## 2018-11-15 DIAGNOSIS — R079 Chest pain, unspecified: Secondary | ICD-10-CM

## 2018-11-17 ENCOUNTER — Encounter: Payer: Self-pay | Admitting: Cardiology

## 2018-11-17 NOTE — Telephone Encounter (Signed)
Error

## 2018-11-17 NOTE — Telephone Encounter (Signed)
F/U Message ° ° ° ° ° ° ° ° ° ° ° °Patient is returning Ben's call ° °

## 2019-01-13 DIAGNOSIS — B029 Zoster without complications: Secondary | ICD-10-CM | POA: Diagnosis not present

## 2019-02-08 DIAGNOSIS — Z779 Other contact with and (suspected) exposures hazardous to health: Secondary | ICD-10-CM | POA: Diagnosis not present

## 2019-02-08 DIAGNOSIS — Z6822 Body mass index (BMI) 22.0-22.9, adult: Secondary | ICD-10-CM | POA: Diagnosis not present

## 2019-02-08 DIAGNOSIS — Z1231 Encounter for screening mammogram for malignant neoplasm of breast: Secondary | ICD-10-CM | POA: Diagnosis not present

## 2019-02-14 DIAGNOSIS — Z85828 Personal history of other malignant neoplasm of skin: Secondary | ICD-10-CM | POA: Diagnosis not present

## 2019-02-14 DIAGNOSIS — L821 Other seborrheic keratosis: Secondary | ICD-10-CM | POA: Diagnosis not present

## 2019-02-14 DIAGNOSIS — Z808 Family history of malignant neoplasm of other organs or systems: Secondary | ICD-10-CM | POA: Diagnosis not present

## 2019-02-14 DIAGNOSIS — D225 Melanocytic nevi of trunk: Secondary | ICD-10-CM | POA: Diagnosis not present

## 2019-02-14 DIAGNOSIS — L814 Other melanin hyperpigmentation: Secondary | ICD-10-CM | POA: Diagnosis not present

## 2019-02-14 DIAGNOSIS — D2271 Melanocytic nevi of right lower limb, including hip: Secondary | ICD-10-CM | POA: Diagnosis not present

## 2019-03-22 DIAGNOSIS — E7800 Pure hypercholesterolemia, unspecified: Secondary | ICD-10-CM | POA: Diagnosis not present

## 2019-03-22 DIAGNOSIS — Z79899 Other long term (current) drug therapy: Secondary | ICD-10-CM | POA: Diagnosis not present

## 2019-03-22 DIAGNOSIS — M818 Other osteoporosis without current pathological fracture: Secondary | ICD-10-CM | POA: Diagnosis not present

## 2019-03-22 DIAGNOSIS — B001 Herpesviral vesicular dermatitis: Secondary | ICD-10-CM | POA: Diagnosis not present

## 2019-03-30 DIAGNOSIS — M818 Other osteoporosis without current pathological fracture: Secondary | ICD-10-CM | POA: Diagnosis not present

## 2019-03-30 DIAGNOSIS — E7800 Pure hypercholesterolemia, unspecified: Secondary | ICD-10-CM | POA: Diagnosis not present

## 2019-03-30 DIAGNOSIS — Z79899 Other long term (current) drug therapy: Secondary | ICD-10-CM | POA: Diagnosis not present

## 2019-03-30 DIAGNOSIS — Z23 Encounter for immunization: Secondary | ICD-10-CM | POA: Diagnosis not present

## 2019-07-05 DIAGNOSIS — Z23 Encounter for immunization: Secondary | ICD-10-CM | POA: Diagnosis not present

## 2019-07-05 DIAGNOSIS — L820 Inflamed seborrheic keratosis: Secondary | ICD-10-CM | POA: Diagnosis not present

## 2019-07-30 DIAGNOSIS — D485 Neoplasm of uncertain behavior of skin: Secondary | ICD-10-CM | POA: Diagnosis not present

## 2019-07-30 DIAGNOSIS — Z23 Encounter for immunization: Secondary | ICD-10-CM | POA: Diagnosis not present

## 2019-07-30 DIAGNOSIS — L570 Actinic keratosis: Secondary | ICD-10-CM | POA: Diagnosis not present

## 2019-08-20 DIAGNOSIS — R87612 Low grade squamous intraepithelial lesion on cytologic smear of cervix (LGSIL): Secondary | ICD-10-CM | POA: Diagnosis not present

## 2019-08-20 DIAGNOSIS — R87610 Atypical squamous cells of undetermined significance on cytologic smear of cervix (ASC-US): Secondary | ICD-10-CM | POA: Diagnosis not present

## 2019-08-20 DIAGNOSIS — N390 Urinary tract infection, site not specified: Secondary | ICD-10-CM | POA: Diagnosis not present

## 2020-02-20 DIAGNOSIS — D2271 Melanocytic nevi of right lower limb, including hip: Secondary | ICD-10-CM | POA: Diagnosis not present

## 2020-02-20 DIAGNOSIS — Z808 Family history of malignant neoplasm of other organs or systems: Secondary | ICD-10-CM | POA: Diagnosis not present

## 2020-02-20 DIAGNOSIS — L814 Other melanin hyperpigmentation: Secondary | ICD-10-CM | POA: Diagnosis not present

## 2020-02-20 DIAGNOSIS — L821 Other seborrheic keratosis: Secondary | ICD-10-CM | POA: Diagnosis not present

## 2020-02-20 DIAGNOSIS — L578 Other skin changes due to chronic exposure to nonionizing radiation: Secondary | ICD-10-CM | POA: Diagnosis not present

## 2020-02-20 DIAGNOSIS — Z85828 Personal history of other malignant neoplasm of skin: Secondary | ICD-10-CM | POA: Diagnosis not present

## 2020-02-20 DIAGNOSIS — D225 Melanocytic nevi of trunk: Secondary | ICD-10-CM | POA: Diagnosis not present

## 2020-02-29 DIAGNOSIS — Z9189 Other specified personal risk factors, not elsewhere classified: Secondary | ICD-10-CM | POA: Diagnosis not present

## 2020-02-29 DIAGNOSIS — R05 Cough: Secondary | ICD-10-CM | POA: Diagnosis not present

## 2020-03-06 DIAGNOSIS — Z1231 Encounter for screening mammogram for malignant neoplasm of breast: Secondary | ICD-10-CM | POA: Diagnosis not present

## 2020-03-06 DIAGNOSIS — Z6823 Body mass index (BMI) 23.0-23.9, adult: Secondary | ICD-10-CM | POA: Diagnosis not present

## 2020-03-06 DIAGNOSIS — Z01419 Encounter for gynecological examination (general) (routine) without abnormal findings: Secondary | ICD-10-CM | POA: Diagnosis not present

## 2020-03-06 DIAGNOSIS — Z779 Other contact with and (suspected) exposures hazardous to health: Secondary | ICD-10-CM | POA: Diagnosis not present

## 2020-03-24 DIAGNOSIS — Z23 Encounter for immunization: Secondary | ICD-10-CM | POA: Diagnosis not present

## 2020-03-24 DIAGNOSIS — B001 Herpesviral vesicular dermatitis: Secondary | ICD-10-CM | POA: Diagnosis not present

## 2020-03-24 DIAGNOSIS — M75102 Unspecified rotator cuff tear or rupture of left shoulder, not specified as traumatic: Secondary | ICD-10-CM | POA: Diagnosis not present

## 2020-03-24 DIAGNOSIS — Z8639 Personal history of other endocrine, nutritional and metabolic disease: Secondary | ICD-10-CM | POA: Diagnosis not present

## 2020-03-24 DIAGNOSIS — M818 Other osteoporosis without current pathological fracture: Secondary | ICD-10-CM | POA: Diagnosis not present

## 2020-03-24 DIAGNOSIS — Z79899 Other long term (current) drug therapy: Secondary | ICD-10-CM | POA: Diagnosis not present

## 2020-03-27 DIAGNOSIS — N870 Mild cervical dysplasia: Secondary | ICD-10-CM | POA: Diagnosis not present

## 2020-03-27 DIAGNOSIS — R87612 Low grade squamous intraepithelial lesion on cytologic smear of cervix (LGSIL): Secondary | ICD-10-CM | POA: Diagnosis not present

## 2020-04-21 DIAGNOSIS — N870 Mild cervical dysplasia: Secondary | ICD-10-CM | POA: Diagnosis not present

## 2020-04-22 ENCOUNTER — Ambulatory Visit: Payer: PPO | Attending: Internal Medicine

## 2020-04-22 DIAGNOSIS — N870 Mild cervical dysplasia: Secondary | ICD-10-CM | POA: Diagnosis not present

## 2020-04-22 DIAGNOSIS — Z23 Encounter for immunization: Secondary | ICD-10-CM

## 2020-04-22 NOTE — Progress Notes (Signed)
   Covid-19 Vaccination Clinic  Name:  Mindy Mann    MRN: [id number] DOB: 08/24/1950  04/22/2020  Mindy Mann was observed post Covid-19 immunization for 15 minutes without incident. She was provided with Vaccine Information Sheet and instruction to access the V-Safe system.   Mindy Mann was instructed to call 911 with any severe reactions post vaccine: Marland Kitchen Difficulty breathing  . Swelling of face and throat  . A fast heartbeat  . A bad rash all over body  . Dizziness and weakness

## 2020-05-05 DIAGNOSIS — R194 Change in bowel habit: Secondary | ICD-10-CM | POA: Diagnosis not present

## 2020-05-05 DIAGNOSIS — Z86010 Personal history of colonic polyps: Secondary | ICD-10-CM | POA: Diagnosis not present

## 2020-05-05 DIAGNOSIS — F419 Anxiety disorder, unspecified: Secondary | ICD-10-CM | POA: Diagnosis not present

## 2020-05-05 DIAGNOSIS — Z800 Family history of malignant neoplasm of digestive organs: Secondary | ICD-10-CM | POA: Diagnosis not present

## 2020-05-29 DIAGNOSIS — M25512 Pain in left shoulder: Secondary | ICD-10-CM | POA: Diagnosis not present

## 2020-06-03 DIAGNOSIS — K635 Polyp of colon: Secondary | ICD-10-CM | POA: Diagnosis not present

## 2020-06-03 DIAGNOSIS — D122 Benign neoplasm of ascending colon: Secondary | ICD-10-CM | POA: Diagnosis not present

## 2020-06-03 DIAGNOSIS — Z86010 Personal history of colonic polyps: Secondary | ICD-10-CM | POA: Diagnosis not present

## 2020-06-03 DIAGNOSIS — Z800 Family history of malignant neoplasm of digestive organs: Secondary | ICD-10-CM | POA: Diagnosis not present

## 2020-06-06 DIAGNOSIS — M25512 Pain in left shoulder: Secondary | ICD-10-CM | POA: Diagnosis not present

## 2020-06-09 DIAGNOSIS — M25512 Pain in left shoulder: Secondary | ICD-10-CM | POA: Diagnosis not present

## 2020-08-07 DIAGNOSIS — Z9189 Other specified personal risk factors, not elsewhere classified: Secondary | ICD-10-CM | POA: Diagnosis not present

## 2020-08-07 DIAGNOSIS — Z20822 Contact with and (suspected) exposure to covid-19: Secondary | ICD-10-CM | POA: Diagnosis not present

## 2020-09-12 IMAGING — CT CT HEART SCORING
2 series · 16 of 20 positions shown, 18 images · non-contrast
Comparison: None.
COMPARISON: None.

Addendum:
EXAM:
OVER-READ INTERPRETATION  CT CHEST

The following report is an over-read performed by radiologist Dr.
Paulus N Ceejay [REDACTED] on 11/15/2018. This over-read
does not include interpretation of cardiac or coronary anatomy or
pathology. The coronary calcium score interpretation by the
cardiologist is attached.
CLINICAL DATA: Risk stratification
Coronary Calcium Score
TECHNIQUE: The patient was scanned on a Siemens Force scanner. Axial
non-contrast 3 mm slices were carried out through the heart. The
data set was analyzed on a dedicated work station and scored using
the Agatson method.

[Series 2: casc 3.0 i36f 2 bestdiast 62 % · axial · 0.33mm/px · z∈[-212,-106]mm · 8 of 45 slices shown, 10 images]
[im 5/45  vessel]
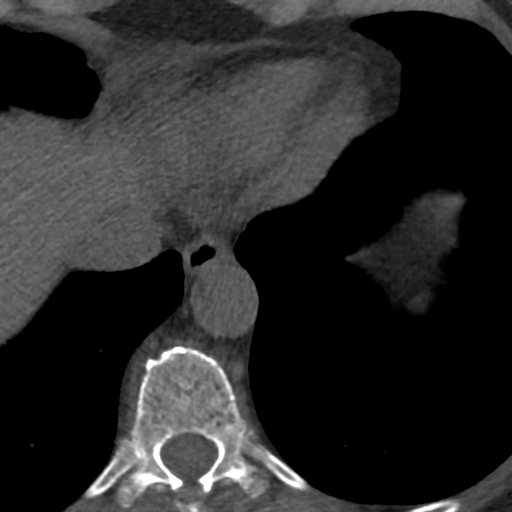
[im 5/45  lung]
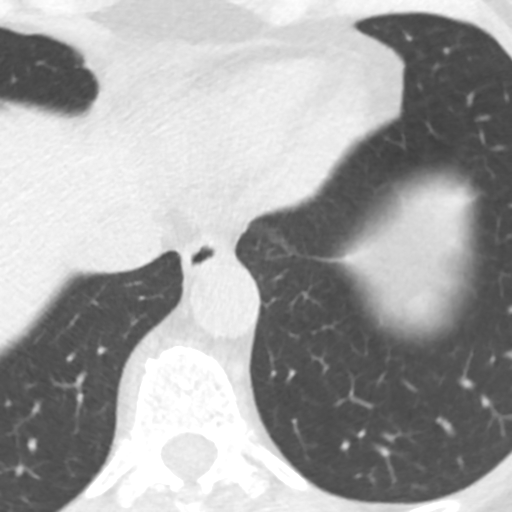
[im 10/45  vessel]
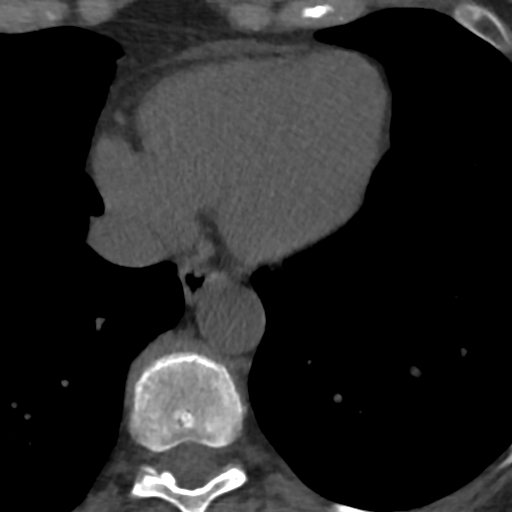
[im 15/45  vessel]
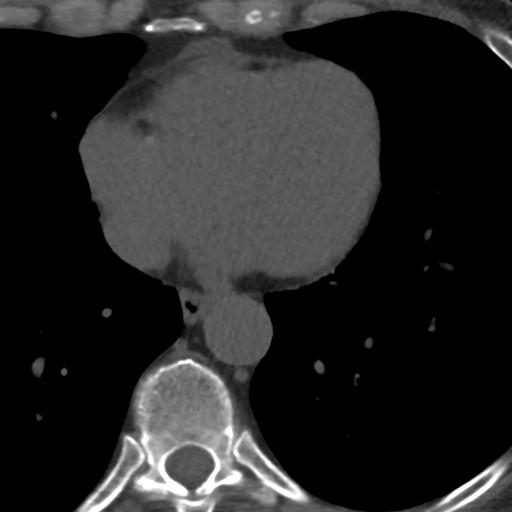
[im 20/45  vessel]
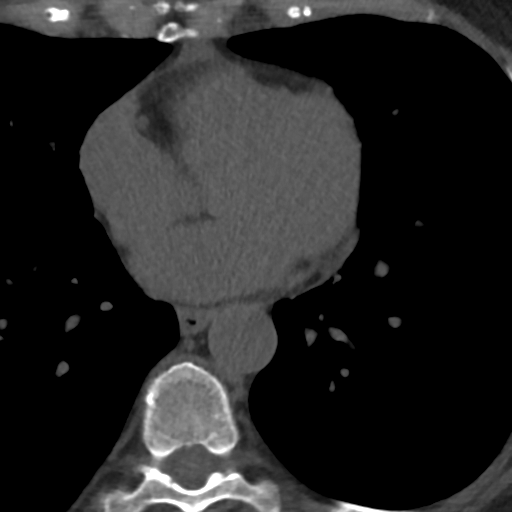
[im 25/45  vessel]
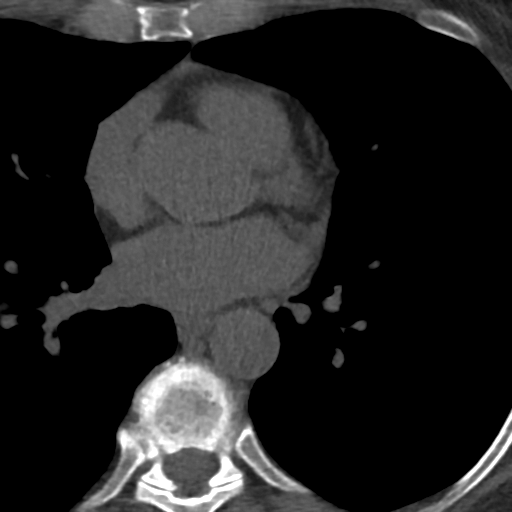
[im 25/45  lung]
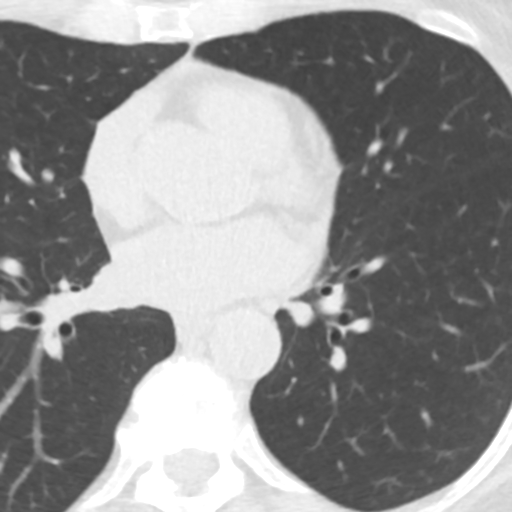
[im 30/45  vessel]
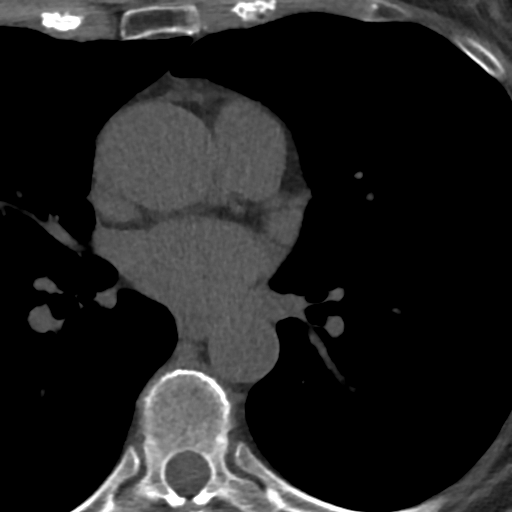
[im 35/45  vessel]
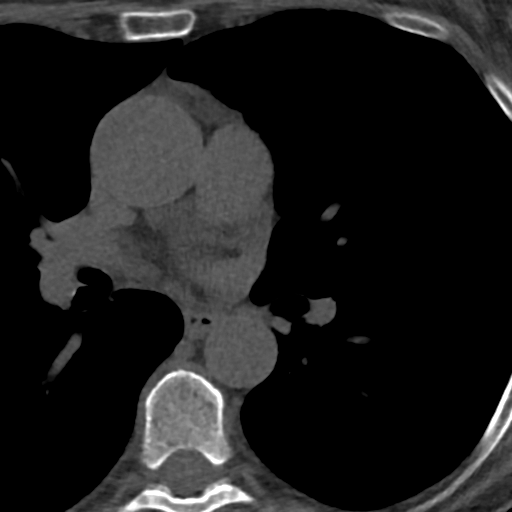
[im 40/45  vessel]
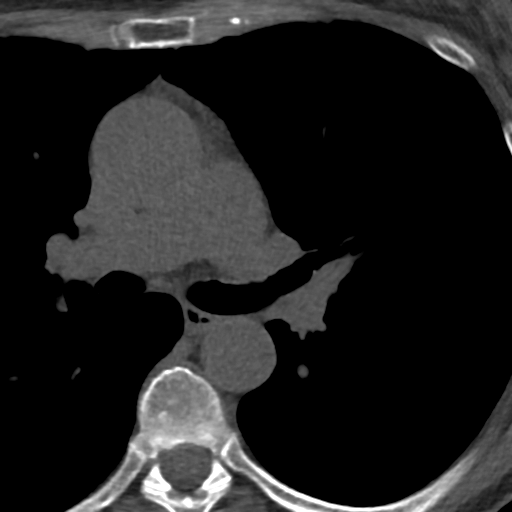

[Series 4: lung st 66 % · axial · 0.68mm/px · z∈[-212,-106]mm · 8 of 45 slices shown]
[im 5/45  lung]
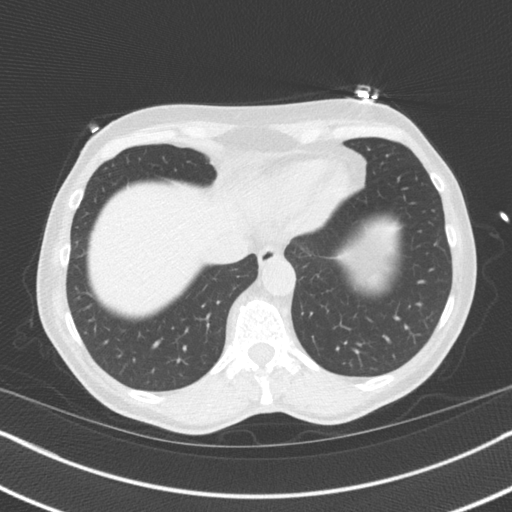
[im 10/45  lung]
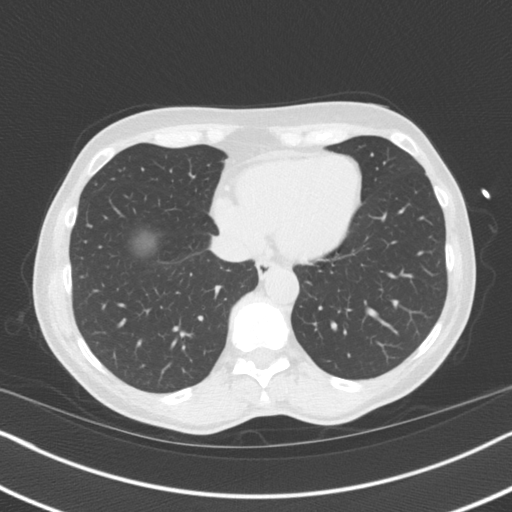
[im 15/45  lung]
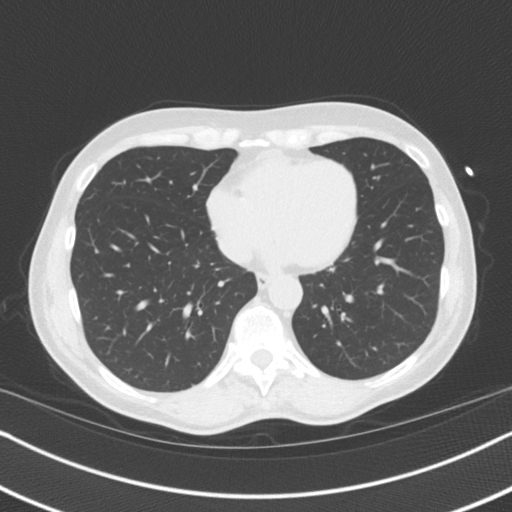
[im 20/45  lung]
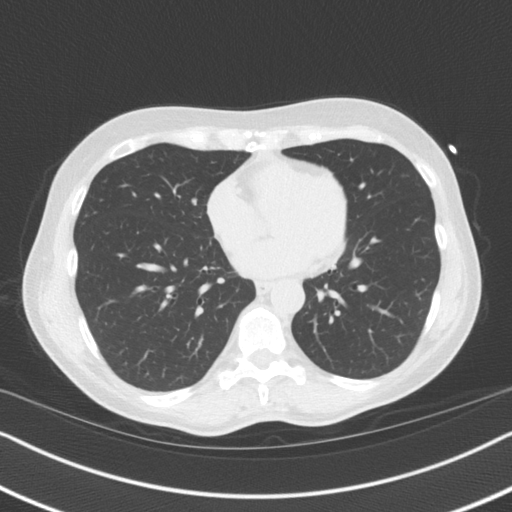
[im 25/45  lung]
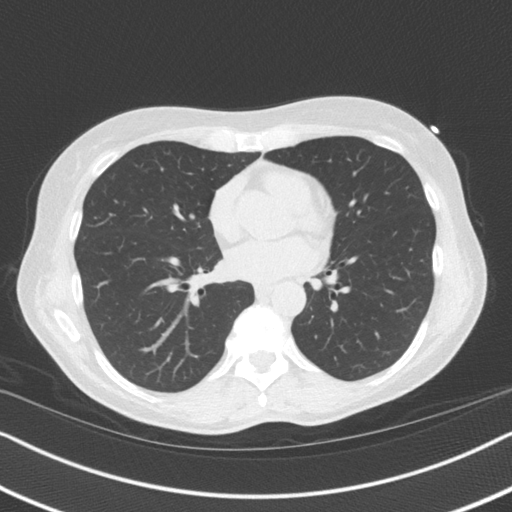
[im 30/45  lung]
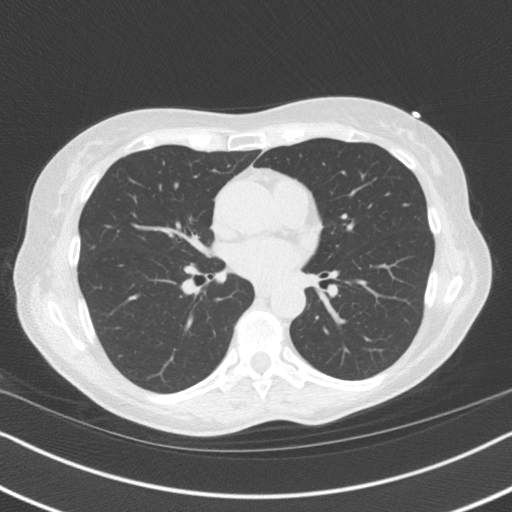
[im 35/45  lung]
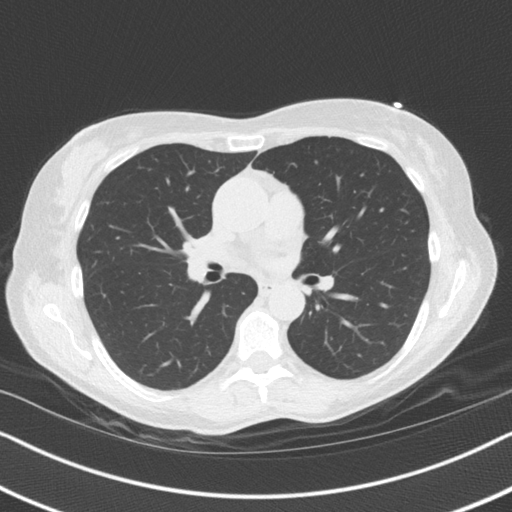
[im 40/45  lung]
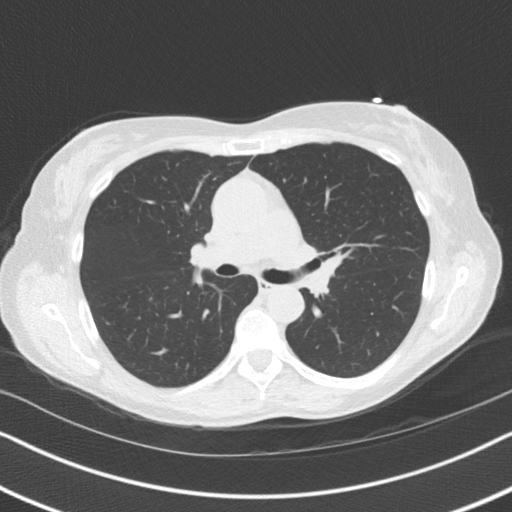

[16 of 20 positions shown; findings below may reference images not displayed]

FINDINGS: Vascular: Heart is normal size.  Visualized aorta normal caliber.

Mediastinum/Nodes: No adenopathy in the lower mediastinum or hila.

Lungs/Pleura: Visualized lungs clear.  No effusions.

Upper Abdomen: Imaging into the upper abdomen shows no acute
findings.

Musculoskeletal: Chest wall soft tissues are unremarkable. No acute
bony abnormality.
IMPRESSION: No acute or significant extracardiac abnormality.
FINDINGS: Non-cardiac: See separate report from [REDACTED].

Ascending Aorta: Normal Caliber.  No calcifications.

Pericardium: Normal

Coronary arteries: Normal coronary origins.
IMPRESSION: Coronary calcium score of 0. This was 0 percentile for age and sex
matched control.

*** End of Addendum ***
EXAM:
OVER-READ INTERPRETATION  CT CHEST

The following report is an over-read performed by radiologist Dr.
Paulus N Ceejay [REDACTED] on 11/15/2018. This over-read
does not include interpretation of cardiac or coronary anatomy or
pathology. The coronary calcium score interpretation by the
cardiologist is attached.
FINDINGS: Vascular: Heart is normal size.  Visualized aorta normal caliber.

Mediastinum/Nodes: No adenopathy in the lower mediastinum or hila.

Lungs/Pleura: Visualized lungs clear.  No effusions.

Upper Abdomen: Imaging into the upper abdomen shows no acute
findings.

Musculoskeletal: Chest wall soft tissues are unremarkable. No acute
bony abnormality.
IMPRESSION: No acute or significant extracardiac abnormality.

## 2020-10-01 DIAGNOSIS — Z779 Other contact with and (suspected) exposures hazardous to health: Secondary | ICD-10-CM | POA: Diagnosis not present

## 2020-10-01 DIAGNOSIS — R87619 Unspecified abnormal cytological findings in specimens from cervix uteri: Secondary | ICD-10-CM | POA: Diagnosis not present

## 2020-12-05 DIAGNOSIS — J069 Acute upper respiratory infection, unspecified: Secondary | ICD-10-CM | POA: Diagnosis not present

## 2020-12-05 DIAGNOSIS — R059 Cough, unspecified: Secondary | ICD-10-CM | POA: Diagnosis not present

## 2020-12-09 DIAGNOSIS — J029 Acute pharyngitis, unspecified: Secondary | ICD-10-CM | POA: Diagnosis not present

## 2020-12-09 DIAGNOSIS — R5383 Other fatigue: Secondary | ICD-10-CM | POA: Diagnosis not present

## 2020-12-09 DIAGNOSIS — J019 Acute sinusitis, unspecified: Secondary | ICD-10-CM | POA: Diagnosis not present

## 2020-12-09 DIAGNOSIS — J40 Bronchitis, not specified as acute or chronic: Secondary | ICD-10-CM | POA: Diagnosis not present

## 2020-12-09 DIAGNOSIS — R059 Cough, unspecified: Secondary | ICD-10-CM | POA: Diagnosis not present

## 2021-02-24 DIAGNOSIS — D2271 Melanocytic nevi of right lower limb, including hip: Secondary | ICD-10-CM | POA: Diagnosis not present

## 2021-02-24 DIAGNOSIS — L723 Sebaceous cyst: Secondary | ICD-10-CM | POA: Diagnosis not present

## 2021-02-24 DIAGNOSIS — Z85828 Personal history of other malignant neoplasm of skin: Secondary | ICD-10-CM | POA: Diagnosis not present

## 2021-02-24 DIAGNOSIS — L578 Other skin changes due to chronic exposure to nonionizing radiation: Secondary | ICD-10-CM | POA: Diagnosis not present

## 2021-02-24 DIAGNOSIS — L814 Other melanin hyperpigmentation: Secondary | ICD-10-CM | POA: Diagnosis not present

## 2021-02-24 DIAGNOSIS — D225 Melanocytic nevi of trunk: Secondary | ICD-10-CM | POA: Diagnosis not present

## 2021-02-24 DIAGNOSIS — L821 Other seborrheic keratosis: Secondary | ICD-10-CM | POA: Diagnosis not present

## 2021-02-24 DIAGNOSIS — Z808 Family history of malignant neoplasm of other organs or systems: Secondary | ICD-10-CM | POA: Diagnosis not present

## 2021-04-02 DIAGNOSIS — Z124 Encounter for screening for malignant neoplasm of cervix: Secondary | ICD-10-CM | POA: Diagnosis not present

## 2021-04-02 DIAGNOSIS — Z6822 Body mass index (BMI) 22.0-22.9, adult: Secondary | ICD-10-CM | POA: Diagnosis not present

## 2021-04-02 DIAGNOSIS — Z1231 Encounter for screening mammogram for malignant neoplasm of breast: Secondary | ICD-10-CM | POA: Diagnosis not present

## 2021-04-08 DIAGNOSIS — B001 Herpesviral vesicular dermatitis: Secondary | ICD-10-CM | POA: Diagnosis not present

## 2021-04-08 DIAGNOSIS — E7800 Pure hypercholesterolemia, unspecified: Secondary | ICD-10-CM | POA: Diagnosis not present

## 2021-04-08 DIAGNOSIS — M818 Other osteoporosis without current pathological fracture: Secondary | ICD-10-CM | POA: Diagnosis not present

## 2021-04-08 DIAGNOSIS — R7301 Impaired fasting glucose: Secondary | ICD-10-CM | POA: Diagnosis not present

## 2021-04-21 DIAGNOSIS — R102 Pelvic and perineal pain: Secondary | ICD-10-CM | POA: Diagnosis not present

## 2021-05-07 DIAGNOSIS — M810 Age-related osteoporosis without current pathological fracture: Secondary | ICD-10-CM | POA: Diagnosis not present

## 2021-06-30 DIAGNOSIS — H5213 Myopia, bilateral: Secondary | ICD-10-CM | POA: Diagnosis not present

## 2021-06-30 DIAGNOSIS — H25813 Combined forms of age-related cataract, bilateral: Secondary | ICD-10-CM | POA: Diagnosis not present

## 2021-06-30 DIAGNOSIS — H524 Presbyopia: Secondary | ICD-10-CM | POA: Diagnosis not present

## 2021-06-30 DIAGNOSIS — H52203 Unspecified astigmatism, bilateral: Secondary | ICD-10-CM | POA: Diagnosis not present

## 2021-07-08 DIAGNOSIS — H2511 Age-related nuclear cataract, right eye: Secondary | ICD-10-CM | POA: Diagnosis not present

## 2021-07-08 DIAGNOSIS — H25812 Combined forms of age-related cataract, left eye: Secondary | ICD-10-CM | POA: Diagnosis not present

## 2021-07-08 DIAGNOSIS — H25811 Combined forms of age-related cataract, right eye: Secondary | ICD-10-CM | POA: Diagnosis not present

## 2021-07-15 DIAGNOSIS — H25012 Cortical age-related cataract, left eye: Secondary | ICD-10-CM | POA: Diagnosis not present

## 2021-07-15 DIAGNOSIS — H2512 Age-related nuclear cataract, left eye: Secondary | ICD-10-CM | POA: Diagnosis not present

## 2021-08-07 DIAGNOSIS — R0982 Postnasal drip: Secondary | ICD-10-CM | POA: Diagnosis not present

## 2021-08-07 DIAGNOSIS — R0981 Nasal congestion: Secondary | ICD-10-CM | POA: Diagnosis not present

## 2021-08-07 DIAGNOSIS — Z20822 Contact with and (suspected) exposure to covid-19: Secondary | ICD-10-CM | POA: Diagnosis not present

## 2021-08-07 DIAGNOSIS — R051 Acute cough: Secondary | ICD-10-CM | POA: Diagnosis not present

## 2021-08-31 DIAGNOSIS — J22 Unspecified acute lower respiratory infection: Secondary | ICD-10-CM | POA: Diagnosis not present

## 2021-08-31 DIAGNOSIS — R5383 Other fatigue: Secondary | ICD-10-CM | POA: Diagnosis not present

## 2021-08-31 DIAGNOSIS — F331 Major depressive disorder, recurrent, moderate: Secondary | ICD-10-CM | POA: Diagnosis not present

## 2021-10-08 DIAGNOSIS — Z124 Encounter for screening for malignant neoplasm of cervix: Secondary | ICD-10-CM | POA: Diagnosis not present

## 2021-10-08 DIAGNOSIS — R87619 Unspecified abnormal cytological findings in specimens from cervix uteri: Secondary | ICD-10-CM | POA: Diagnosis not present

## 2021-10-08 DIAGNOSIS — R87610 Atypical squamous cells of undetermined significance on cytologic smear of cervix (ASC-US): Secondary | ICD-10-CM | POA: Diagnosis not present

## 2021-10-08 DIAGNOSIS — Z1151 Encounter for screening for human papillomavirus (HPV): Secondary | ICD-10-CM | POA: Diagnosis not present

## 2021-11-10 DIAGNOSIS — Z961 Presence of intraocular lens: Secondary | ICD-10-CM | POA: Diagnosis not present

## 2021-11-10 DIAGNOSIS — H26492 Other secondary cataract, left eye: Secondary | ICD-10-CM | POA: Diagnosis not present

## 2021-11-25 DIAGNOSIS — R87810 Cervical high risk human papillomavirus (HPV) DNA test positive: Secondary | ICD-10-CM | POA: Diagnosis not present

## 2021-11-25 DIAGNOSIS — N870 Mild cervical dysplasia: Secondary | ICD-10-CM | POA: Diagnosis not present

## 2021-12-02 DIAGNOSIS — H26492 Other secondary cataract, left eye: Secondary | ICD-10-CM | POA: Diagnosis not present

## 2022-01-14 DIAGNOSIS — R1084 Generalized abdominal pain: Secondary | ICD-10-CM | POA: Diagnosis not present

## 2022-01-14 DIAGNOSIS — R5383 Other fatigue: Secondary | ICD-10-CM | POA: Diagnosis not present

## 2022-01-14 DIAGNOSIS — E559 Vitamin D deficiency, unspecified: Secondary | ICD-10-CM | POA: Diagnosis not present

## 2022-01-14 DIAGNOSIS — M810 Age-related osteoporosis without current pathological fracture: Secondary | ICD-10-CM | POA: Diagnosis not present

## 2022-01-15 ENCOUNTER — Other Ambulatory Visit (HOSPITAL_BASED_OUTPATIENT_CLINIC_OR_DEPARTMENT_OTHER): Payer: Self-pay | Admitting: Family Medicine

## 2022-01-15 DIAGNOSIS — R1084 Generalized abdominal pain: Secondary | ICD-10-CM

## 2022-01-16 ENCOUNTER — Ambulatory Visit (HOSPITAL_BASED_OUTPATIENT_CLINIC_OR_DEPARTMENT_OTHER)
Admission: RE | Admit: 2022-01-16 | Discharge: 2022-01-16 | Disposition: A | Discharge status: Home / Self Care | Payer: PPO | Source: Ambulatory Visit | Attending: Family Medicine | Admitting: Family Medicine

## 2022-01-16 DIAGNOSIS — R1084 Generalized abdominal pain: Secondary | ICD-10-CM | POA: Insufficient documentation

## 2022-01-16 DIAGNOSIS — R109 Unspecified abdominal pain: Secondary | ICD-10-CM | POA: Diagnosis not present

## 2022-01-16 DIAGNOSIS — I700 Atherosclerosis of aorta: Secondary | ICD-10-CM | POA: Diagnosis not present

## 2022-01-16 MED ORDER — IOHEXOL 300 MG/ML  SOLN
100.0000 mL | Freq: Once | INTRAMUSCULAR | Status: AC | PRN
  Administered 2022-01-16: 80 mL via INTRAVENOUS

## 2022-01-18 ENCOUNTER — Telehealth: Payer: Self-pay | Admitting: Pharmacy Technician

## 2022-01-18 ENCOUNTER — Other Ambulatory Visit: Payer: Self-pay

## 2022-01-18 DIAGNOSIS — M810 Age-related osteoporosis without current pathological fracture: Secondary | ICD-10-CM

## 2022-01-18 NOTE — Telephone Encounter (Signed)
Auth Submission: no auth needed Payer: [employer] Medication & CPT/J Code(s) submitted: Reclast (Zolendronic acid) C7893 Route of submission (phone, fax, portal): phone Auth type: Buy/Bill Units/visits requested: x1 dose Reference number: [id number] Approval from: 01/18/22 to 06/20/22

## 2022-01-21 ENCOUNTER — Ambulatory Visit (INDEPENDENT_AMBULATORY_CARE_PROVIDER_SITE_OTHER): Payer: PPO

## 2022-01-21 VITALS — BP 148/74 | HR 62 | Temp 97.6°F | Resp 18 | Ht 64.0 in | Wt 131.6 lb

## 2022-01-21 DIAGNOSIS — M810 Age-related osteoporosis without current pathological fracture: Secondary | ICD-10-CM

## 2022-01-21 MED ORDER — DIPHENHYDRAMINE HCL 25 MG PO CAPS
25.0000 mg | ORAL_CAPSULE | Freq: Once | ORAL | Status: AC
  Administered 2022-01-21: 25 mg via ORAL
  Filled 2022-01-21: qty 1

## 2022-01-21 MED ORDER — ACETAMINOPHEN 325 MG PO TABS
650.0000 mg | ORAL_TABLET | Freq: Once | ORAL | Status: AC
  Administered 2022-01-21: 650 mg via ORAL
  Filled 2022-01-21: qty 2

## 2022-01-21 MED ORDER — ZOLEDRONIC ACID 5 MG/100ML IV SOLN
5.0000 mg | Freq: Once | INTRAVENOUS | Status: AC
  Administered 2022-01-21: 5 mg via INTRAVENOUS
  Filled 2022-01-21: qty 100

## 2022-01-21 MED ORDER — SODIUM CHLORIDE 0.9 % IV SOLN: INTRAVENOUS | Status: DC

## 2022-01-21 NOTE — Progress Notes (Signed)
Diagnosis: Osteoporosis  Provider:  Marshell Garfinkel, MD  Procedure: Infusion  IV Type: Peripheral, IV Location: L Antecubital  Reclast (Zolendronic Acid), Dose: 5 mg  Infusion Start Time: 1500  Infusion Stop Time: 3073  Post Infusion IV Care: Observation period completed and Peripheral IV Discontinued  Discharge: Condition: Good, Destination: Home . AVS provided to patient.   Performed by:  Adelina Mings, LPN

## 2022-02-24 DIAGNOSIS — Z85828 Personal history of other malignant neoplasm of skin: Secondary | ICD-10-CM | POA: Diagnosis not present

## 2022-02-24 DIAGNOSIS — D225 Melanocytic nevi of trunk: Secondary | ICD-10-CM | POA: Diagnosis not present

## 2022-02-24 DIAGNOSIS — L821 Other seborrheic keratosis: Secondary | ICD-10-CM | POA: Diagnosis not present

## 2022-02-24 DIAGNOSIS — L578 Other skin changes due to chronic exposure to nonionizing radiation: Secondary | ICD-10-CM | POA: Diagnosis not present

## 2022-02-24 DIAGNOSIS — L723 Sebaceous cyst: Secondary | ICD-10-CM | POA: Diagnosis not present

## 2022-02-24 DIAGNOSIS — D2271 Melanocytic nevi of right lower limb, including hip: Secondary | ICD-10-CM | POA: Diagnosis not present

## 2022-02-24 DIAGNOSIS — L814 Other melanin hyperpigmentation: Secondary | ICD-10-CM | POA: Diagnosis not present

## 2022-03-13 DIAGNOSIS — U071 COVID-19: Secondary | ICD-10-CM | POA: Diagnosis not present

## 2022-03-25 DIAGNOSIS — I700 Atherosclerosis of aorta: Secondary | ICD-10-CM | POA: Diagnosis not present

## 2022-03-25 DIAGNOSIS — U071 COVID-19: Secondary | ICD-10-CM | POA: Diagnosis not present

## 2022-03-25 DIAGNOSIS — F331 Major depressive disorder, recurrent, moderate: Secondary | ICD-10-CM | POA: Diagnosis not present

## 2022-04-27 DIAGNOSIS — N958 Other specified menopausal and perimenopausal disorders: Secondary | ICD-10-CM | POA: Diagnosis not present

## 2022-04-27 DIAGNOSIS — Z124 Encounter for screening for malignant neoplasm of cervix: Secondary | ICD-10-CM | POA: Diagnosis not present

## 2022-04-27 DIAGNOSIS — M816 Localized osteoporosis [Lequesne]: Secondary | ICD-10-CM | POA: Diagnosis not present

## 2022-04-27 DIAGNOSIS — Z1231 Encounter for screening mammogram for malignant neoplasm of breast: Secondary | ICD-10-CM | POA: Diagnosis not present

## 2022-04-27 DIAGNOSIS — Z6822 Body mass index (BMI) 22.0-22.9, adult: Secondary | ICD-10-CM | POA: Diagnosis not present

## 2022-04-27 DIAGNOSIS — R29890 Loss of height: Secondary | ICD-10-CM | POA: Diagnosis not present

## 2022-04-27 DIAGNOSIS — Z1151 Encounter for screening for human papillomavirus (HPV): Secondary | ICD-10-CM | POA: Diagnosis not present

## 2022-04-27 DIAGNOSIS — Z8262 Family history of osteoporosis: Secondary | ICD-10-CM | POA: Diagnosis not present

## 2022-05-07 DIAGNOSIS — D235 Other benign neoplasm of skin of trunk: Secondary | ICD-10-CM | POA: Diagnosis not present

## 2022-05-21 DIAGNOSIS — L089 Local infection of the skin and subcutaneous tissue, unspecified: Secondary | ICD-10-CM | POA: Diagnosis not present

## 2022-05-21 DIAGNOSIS — L729 Follicular cyst of the skin and subcutaneous tissue, unspecified: Secondary | ICD-10-CM | POA: Diagnosis not present

## 2022-05-25 DIAGNOSIS — E7800 Pure hypercholesterolemia, unspecified: Secondary | ICD-10-CM | POA: Diagnosis not present

## 2022-05-25 DIAGNOSIS — R7303 Prediabetes: Secondary | ICD-10-CM | POA: Diagnosis not present

## 2022-06-03 ENCOUNTER — Ambulatory Visit: Payer: Self-pay | Admitting: Surgery

## 2022-06-03 DIAGNOSIS — L729 Follicular cyst of the skin and subcutaneous tissue, unspecified: Secondary | ICD-10-CM | POA: Diagnosis not present

## 2022-06-03 NOTE — H&P (Signed)
Mindy Mann [id number]   Referring Provider:  Windy Carina, NP   Subjective   Chief Complaint: New Consultation (dermoid cyst of skin of back)     History of Present Illness:    Pleasant and otherwise healthy 71 year old woman who presents with a cyst on her upper back.  This has been present for about 2 years and initially was minimally symptomatic.  She states that her dermatologist recommended against removing it and had told her that the scar would be worse than the cyst.  Over the last few months however it has increased in size, become more uncomfortable, and became infected.  She was unable to get a soon enough appointment with her dermatologist, but did go see her primary care doctor who noted that this was floridly infected and initiated antibiotic therapy.  She has gone through 3 different antibiotics in the last month as well as warm compresses.  She did seek potential I&D at an urgent care, but decided against it due to concerns for pain.  Notes some purulent drainage from the area.  This is finally started to improve, but there is a residual cyst and some residual erythema.  She is interested in having this removed.  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  She is closing on a new condo which she will be moving into in early February.  She is hoping to have this procedure done as quickly as possible.   Review of Systems: A complete review of systems was obtained from the patient.  I have reviewed this information and discussed as appropriate with the patient.  See HPI as well for other ROS.   Medical History: Past Medical History:  Diagnosis Date   Asthma, unspecified asthma severity, unspecified whether complicated, unspecified whether persistent    Hyperlipidemia     There is no problem list on file for this patient.   Past Surgical History:  Procedure Laterality Date   BREAST EXCISIONAL BIOPSY     CATARACT EXTRACTION     meniscus repair     rotator  cuff surgery       No Known Allergies  Current Outpatient Medications on File Prior to Visit  Medication Sig Dispense Refill   cholecalciferol, vitamin D3, 10 mcg (400 unit) Cap Vitamin D3     Lactobacillus acidophilus (PROBIOTIC ORAL) Probiotic     RED ALGAE, BULK, MISC 3 capsules by Misc.(Non-Drug; Combo Route) route.     sertraline (ZOLOFT) 50 MG tablet Take 1.5 tablets by mouth once daily     vitamin K2 100 mcg Cap Take by mouth     No current facility-administered medications on file prior to visit.    Family History  Problem Relation Age of Onset   Coronary Artery Disease (Blocked arteries around heart) Mother      Social History   Tobacco Use  Smoking Status Former   Types: Cigarettes   Quit date: 2008   Years since quitting: 15.9  Smokeless Tobacco Never     Social History   Socioeconomic History   Marital status: Widowed  Tobacco Use   Smoking status: Former    Types: Cigarettes    Quit date: 2008    Years since quitting: 15.9   Smokeless tobacco: Never  Vaping Use   Vaping Use: Never used  Substance and Sexual Activity   Alcohol use: Yes    Alcohol/week: 1.0 - 2.0 standard drink of alcohol    Types: 1 - 2 Glasses of wine per  week   Drug use: Never    Objective:    Vitals:   06/03/22 1035  BP: 134/72  Pulse: 94  Temp: 36.8 C (98.3 F)  SpO2: 99%  Weight: 58.2 kg (128 lb 3.2 oz)  Height: 162.6 cm (_0 )    Body mass index is 22.01 kg/m.  Gen: A&Ox3, no distress  Unlabored respirations On the mid upper back there is an approximately 1 x 1.5 cm subcutaneous cyst with overlying erythema, no fluctuance, induration or warmth but there is minimal tenderness.  Assessment and Plan:  Diagnoses and all orders for this visit:  Subcutaneous cyst    This has been present for about 2 years but recently has become infected and does seem to have some residual inflammation and a residual cyst wall.  We discussed excision under MAC.  Discussed the  procedure and risks of bleeding, infection, pain, scarring, wound healing problems, undesirable cosmetic outcome, cyst recurrence.  Questions welcomed and answered.  She wishes to proceed with surgery.  Chidera Dearcos Raquel James, MD

## 2022-06-07 NOTE — Patient Instructions (Signed)
SURGICAL WAITING ROOM VISITATION Patients having surgery or a procedure may have no more than 2 support people in the waiting area - these visitors may rotate.   Children under the age of 102 must have an adult with them who is not the patient. If the patient needs to stay at the hospital during part of their recovery, the visitor guidelines for inpatient rooms apply. Pre-op nurse will coordinate an appropriate time for 1 support person to accompany patient in pre-op.  This support person may not rotate.    Please refer to the [facility] website for the visitor guidelines for Inpatients (after your surgery is over and you are in a regular room).      Your procedure is scheduled on: 06-28-22   Report to [facility] Main Entrance    Report to admitting at 8:15 AM   Call this number if you have problems the morning of surgery [phone]   Do not eat food or drink liquids:After Midnight.          If you have questions, please contact your surgeon's office.   FOLLOW  ANY ADDITIONAL PRE OP INSTRUCTIONS YOU RECEIVED FROM YOUR SURGEON'S OFFICE!!!     Oral Hygiene is also important to reduce your risk of infection.                                    Remember - BRUSH YOUR TEETH THE MORNING OF SURGERY WITH YOUR REGULAR TOOTHPASTE   Take these medicines the morning of surgery with A SIP OF WATER:   Sertraline                              You may not have any metal on your body including hair pins, jewelry, and body piercing             Do not wear make-up, lotions, powders, perfumes or deodorant  Do not wear nail polish including gel and S&S, artificial/acrylic nails, or any other type of covering on natural nails including finger and toenails. If you have artificial nails, gel coating, etc. that needs to be removed by a nail salon please have this removed prior to surgery or surgery may need to be canceled/ delayed if the surgeon/ anesthesia feels like they are unable to be safely  monitored.   Do not shave  48 hours prior to surgery.    Do not bring valuables to the hospital. [place].   Contacts, dentures or bridgework may not be worn into surgery.  DO NOT [place]. PHARMACY WILL DISPENSE MEDICATIONS LISTED ON YOUR MEDICATION LIST TO YOU DURING YOUR ADMISSION [place]!    Patients discharged on the day of surgery will not be allowed to drive home.  Someone NEEDS to stay with you for the first 24 hours after anesthesia.              Please read over the following fact sheets you were given: IF [place] Apolonio Schneiders  If you received a COVID test during your pre-op visit  it is requested that you wear a mask when out in public, stay away from anyone that may not be feeling well and notify your surgeon if you develop symptoms. If you test  positive for Covid or have been in contact with anyone that has tested positive in the last 10 days please notify you surgeon.  [facility] - Preparing for Surgery Before surgery, you can play an important role.  Because skin is not sterile, your skin needs to be as free of germs as possible.  You can reduce the number of germs on your skin by washing with CHG (chlorahexidine gluconate) soap before surgery.  CHG is an antiseptic cleaner which kills germs and bonds with the skin to continue killing germs even after washing. Please DO NOT use if you have an allergy to CHG or antibacterial soaps.  If your skin becomes reddened/irritated stop using the CHG and inform your nurse when you arrive at Short Stay. Do not shave (including legs and underarms) for at least 48 hours prior to the first CHG shower.  You may shave your face/neck.  Please follow these instructions carefully:  1.  Shower with CHG Soap the night before surgery and the  morning of surgery.  2.  If you choose to wash your hair, wash your  hair first as usual with your normal  shampoo.  3.  After you shampoo, rinse your hair and body thoroughly to remove the shampoo.                             4.  Use CHG as you would any other liquid soap.  You can apply chg directly to the skin and wash.  Gently with a scrungie or clean washcloth.  5.  Apply the CHG Soap to your body ONLY FROM THE NECK DOWN.   Do   not use on face/ open                           Wound or open sores. Avoid contact with eyes, ears mouth and   genitals (private parts).                       Wash face,  Genitals (private parts) with your normal soap.             6.  Wash thoroughly, paying special attention to the area where your    surgery  will be performed.  7.  Thoroughly rinse your body with warm water from the neck down.  8.  DO NOT shower/wash with your normal soap after using and rinsing off the CHG Soap.                9.  Pat yourself dry with a clean towel.            10.  Wear clean pajamas.            11.  Place clean sheets on your bed the night of your first shower and do not  sleep with pets. Day of Surgery : Do not apply any lotions/deodorants the morning of surgery.  Please wear clean clothes to the hospital/surgery center.  FAILURE TO FOLLOW THESE INSTRUCTIONS MAY RESULT IN THE CANCELLATION OF YOUR SURGERY  PATIENT SIGNATURE_________________________________  NURSE SIGNATURE__________________________________  ________________________________________________________________________

## 2022-06-08 ENCOUNTER — Encounter (HOSPITAL_COMMUNITY): Payer: Self-pay

## 2022-06-08 ENCOUNTER — Encounter (HOSPITAL_COMMUNITY)
Admission: RE | Admit: 2022-06-08 | Discharge: 2022-06-08 | Disposition: A | Discharge status: Home / Self Care | Payer: PPO | Source: Ambulatory Visit | Attending: Surgery | Admitting: Surgery

## 2022-06-08 DIAGNOSIS — Z01818 Encounter for other preprocedural examination: Secondary | ICD-10-CM | POA: Insufficient documentation

## 2022-06-08 HISTORY — DX: Personal history of urinary calculi: Z87.442

## 2022-06-08 HISTORY — DX: Headache, unspecified: R51.9

## 2022-06-08 NOTE — Progress Notes (Signed)
COVID Vaccine Completed: yes  Date of COVID positive in last 90 days: yes september   PCP - Jonathon Jordan, MD Cardiologist - Fransico Him, MD for chest pain- no issues  Chest x-ray - n/a EKG - n/a Stress Test - in [place] 2008 per pt no issues ECHO - 11/15/18 Epic Cardiac Cath - n/a Pacemaker/ICD device last checked: n/a Spinal Cord Stimulator: n/a  Bowel Prep - no  Sleep Study - n/a CPAP -   Fasting Blood Sugar - n/a Checks Blood Sugar _____ times a day  Last dose of GLP1 agonist-  N/A GLP1 instructions:  N/A   Last dose of SGLT-2 inhibitors-  N/A SGLT-2 instructions: N/A   Blood Thinner Instructions: n/a Aspirin Instructions: Last Dose:  Activity level: Can go up a flight of stairs and perform activities of daily living without stopping and without symptoms of chest pain or shortness of breath.   Anesthesia review:   Patient denies shortness of breath, fever, cough and chest pain at PAT appointment  Patient verbalized understanding of instructions that were given to them at the PAT appointment. Patient was also instructed that they will need to review over the PAT instructions again at home before surgery.

## 2022-06-18 NOTE — Progress Notes (Signed)
Patient called stating she recently found out she has bed bugs in her apartment. She denies any current rash. She is getting her apartment treated 06/24/22 and surgery is scheduled 06/28/22. Per the exterminators once the treatment is done patient does not have to be concerned about the bed bugs. Instructed patient to call surgeon's office so they are aware. Also instructed to call if she develops any rash or anything changes. Patient verbalized understanding.

## 2022-06-27 ENCOUNTER — Encounter (HOSPITAL_COMMUNITY): Payer: Self-pay | Admitting: Anesthesiology

## 2022-06-27 NOTE — Anesthesia Preprocedure Evaluation (Deleted)
Anesthesia Evaluation  Patient identified by MRN, date of birth, ID band Patient awake    Reviewed: Allergy & Precautions, NPO status , Patient's Chart, lab work & pertinent test results  Airway        Dental   Pulmonary neg pulmonary ROS          Cardiovascular negative cardio ROS      Neuro/Psych  Headaches PSYCHIATRIC DISORDERS  Depression       GI/Hepatic negative GI ROS, Neg liver ROS,,,  Endo/Other  negative endocrine ROS    Renal/GU negative Renal ROS     Musculoskeletal negative musculoskeletal ROS (+)  INFECTED SUBCUTANEOUS CYST   Abdominal   Peds  Hematology negative hematology ROS (+)   Anesthesia Other Findings Day of surgery medications reviewed with the patient.  Reproductive/Obstetrics                             Anesthesia Physical Anesthesia Plan  ASA: 2  Anesthesia Plan: MAC   Post-op Pain Management: Tylenol PO (pre-op)*   Induction: Intravenous  PONV Risk Score and Plan: 2 and TIVA and Treatment may vary due to age or medical condition  Airway Management Planned: Natural Airway and Simple Face Mask  Additional Equipment:   Intra-op Plan:   Post-operative Plan:   Informed Consent:   Plan Discussed with:   Anesthesia Plan Comments:        Anesthesia Quick Evaluation

## 2022-06-28 ENCOUNTER — Encounter (HOSPITAL_COMMUNITY): Admission: RE | Payer: Self-pay | Source: Home / Self Care

## 2022-06-28 ENCOUNTER — Ambulatory Visit (HOSPITAL_COMMUNITY): Admission: RE | Admit: 2022-06-28 | Payer: PPO | Source: Home / Self Care | Admitting: Surgery

## 2022-06-28 SURGERY — EXCISION, KELOID
Anesthesia: Monitor Anesthesia Care | Site: Back

## 2022-06-28 MED ORDER — BUPIVACAINE-EPINEPHRINE (PF) 0.5% -1:200000 IJ SOLN
INTRAMUSCULAR | Status: AC
  Filled 2022-06-28: qty 30

## 2022-06-28 NOTE — Progress Notes (Signed)
Patient called this morning about arrival time for surgery today.  Patient stated she has bed bugs and would not be coming in for surgery today.    Dr Kae Heller made aware

## 2022-12-21 ENCOUNTER — Ambulatory Visit
Admission: RE | Admit: 2022-12-21 | Discharge: 2022-12-21 | Disposition: A | Discharge status: Home / Self Care | Payer: PPO | Source: Ambulatory Visit | Attending: Family Medicine | Admitting: Family Medicine

## 2022-12-21 ENCOUNTER — Other Ambulatory Visit: Payer: Self-pay | Admitting: Family Medicine

## 2022-12-21 DIAGNOSIS — R042 Hemoptysis: Secondary | ICD-10-CM

## 2022-12-21 DIAGNOSIS — F331 Major depressive disorder, recurrent, moderate: Secondary | ICD-10-CM | POA: Diagnosis not present

## 2022-12-21 DIAGNOSIS — I700 Atherosclerosis of aorta: Secondary | ICD-10-CM | POA: Diagnosis not present

## 2023-01-05 ENCOUNTER — Ambulatory Visit (INDEPENDENT_AMBULATORY_CARE_PROVIDER_SITE_OTHER): Payer: PPO | Admitting: Otolaryngology

## 2023-01-05 ENCOUNTER — Encounter (INDEPENDENT_AMBULATORY_CARE_PROVIDER_SITE_OTHER): Payer: Self-pay | Admitting: Otolaryngology

## 2023-01-05 VITALS — BP 128/83 | HR 86 | Ht 64.0 in | Wt 124.0 lb

## 2023-01-05 DIAGNOSIS — Z87891 Personal history of nicotine dependence: Secondary | ICD-10-CM

## 2023-01-05 DIAGNOSIS — J342 Deviated nasal septum: Secondary | ICD-10-CM

## 2023-01-05 DIAGNOSIS — K219 Gastro-esophageal reflux disease without esophagitis: Secondary | ICD-10-CM

## 2023-01-05 DIAGNOSIS — J358 Other chronic diseases of tonsils and adenoids: Secondary | ICD-10-CM | POA: Diagnosis not present

## 2023-01-05 DIAGNOSIS — R0981 Nasal congestion: Secondary | ICD-10-CM | POA: Diagnosis not present

## 2023-01-05 DIAGNOSIS — R042 Hemoptysis: Secondary | ICD-10-CM | POA: Diagnosis not present

## 2023-01-05 NOTE — Progress Notes (Signed)
ENT CONSULT:  Reason for Consult: hemoptysis    HPI: Mindy Mann is an 72 y.o. female former smoker, quit 15 yrs ago, used to smoke 2 PPD, here for an episode of oral bleeding/hemoptysis.   She was brushing her teeth and all of a sudden started to spit up blood. She did not see any evidence of blood on the tooth brush, denies dental problems and does not think the source of bleeding was her teeth, gums. She feels that she spit up a blood clot and saliva (quarter size) and it was bright red. Then she rinsed her mouth and it stopped. She is not on blood thinner. Denies pain in her throat or in her mouth. She has dry cough all the time, and has had it for a while. She has had sinus infections and last one was last Fall. She was sick and then was coughing a lot. She has osteoporosis takes vitamin K and also takes Peppermint oil pills and probiotic. She is on calcium supplement/Vitamin D. Denies hx of lung disease, was a smoker in the past quit 15-16 yrs ago. She used to smoke 1-2 packs per day before. She drinks 1-2 glasses of wine a day. No cancer hx. had a negative chest x-ray in July of this year but never had CT of her chest for cancer screening.  Records Reviewed:  Had CXR 12/21/2022   FINDINGS: Cardiac silhouette is unremarkable. No pneumothorax or pleural effusion. The lungs are clear. There are thoracic degenerative changes.   IMPRESSION: No acute cardiopulmonary process.    Past Medical History:  Diagnosis Date   Cancer (HCC)    suspicous skin cells   Chest pain    Depression    Headache    mirgaines   History of kidney stones    Sinusitis     Past Surgical History:  Procedure Laterality Date   BREAST BIOPSY     KNEE SURGERY     ROTATOR CUFF REPAIR      Family History  Problem Relation Age of Onset   Heart attack Father 33   Heart murmur Sister        vale replacement    Social History:  reports that she quit smoking about 6 years ago. Her smoking use included  cigarettes. She has never used smokeless tobacco. She reports current alcohol use of about 10.0 standard drinks of alcohol per week. She reports that she does not use drugs.  Allergies: Not on File  Medications: I have reviewed the patient's current medications.  The PMH, PSH, Medications, Allergies, and SH were reviewed and updated.  ROS: Constitutional: Negative for fever, weight loss and weight gain. Cardiovascular: Negative for chest pain and dyspnea on exertion. Respiratory: Is not experiencing shortness of breath at rest. Gastrointestinal: Negative for nausea and vomiting. Neurological: Negative for headaches. Psychiatric: The patient is not nervous/anxiou  Blood pressure 128/83, pulse 86, height 5\' 4"  (1.626 m), weight 124 lb (56.2 kg), SpO2 98%.  PHYSICAL EXAM:  Exam: General: Well-developed, well-nourished Communication and Voice: Clear pitch and clarity Respiratory Respiratory effort: Equal inspiration and expiration without stridor Cardiovascular Peripheral Vascular: Warm extremities with equal color/perfusion Eyes: No nystagmus with equal extraocular motion bilaterally Neuro/Psych/Balance: Patient oriented to person, place, and time; Appropriate mood and affect; Gait is intact with no imbalance; Cranial nerves I-XII are intact Head and Face Inspection: Normocephalic and atraumatic without mass or lesion Palpation: Facial skeleton intact without bony stepoffs Salivary Glands: No mass or tenderness Facial Strength: Facial motility  symmetric and full bilaterally ENT Pinna: External ear intact and fully developed External canal: Canal is patent with intact skin Tympanic Membrane: Clear and mobile External Nose: No scar or anatomic deformity Internal Nose: Septum intact and midline. No edema, polyp, or rhinorrhea Lips, Teeth, and gums: Mucosa and teeth intact and viable TMJ: No pain to palpation with full mobility Oral cavity/oropharynx: No erythema or exudate, no  lesions present.  Scant residual tonsillar tissue present with a small tonsillar cyst on the right upper aspect of her throat.  No other lesions or mucosal changes.  No blood or clot or regular appearing mucosa. Nasopharynx: No mass or lesion with intact mucosa Hypopharynx: Intact mucosa without pooling of secretions Larynx Glottic: Full true vocal cord mobility without lesion or mass Supraglottic: Normal appearing epiglottis and AE folds.  Slightly prominent vasculature along the superior aspect of epiglottis but no blood or clot or active bleeding or mucosal irregularities Interarytenoid Space: No or minimal pachydermia or edema Subglottic Space: Patent without lesion or edema Neck Neck and Trachea: Midline trachea without mass or lesion Thyroid: No mass or nodularity Lymphatics: No lymphadenopathy  Procedure: Preoperative diagnosis: hemoptysis   Postoperative diagnosis:   Same  Procedure: Flexible fiberoptic laryngoscopy  Surgeon: Ashok Croon, MD  Anesthesia: Topical lidocaine and Afrin Complications: None Condition is stable throughout exam  Indications and consent:  The patient presents to the clinic with Indirect laryngoscopy view was incomplete. Thus it was recommended that they undergo a flexible fiberoptic laryngoscopy. All of the risks, benefits, and potential complications were reviewed with the patient preoperatively and verbal informed consent was obtained.  Procedure: The patient was seated upright in the clinic. Topical lidocaine and Afrin were applied to the nasal cavity. After adequate anesthesia had occurred, I then proceeded to pass the flexible telescope into the nasal cavity. The nasal cavity was patent without rhinorrhea or polyp. The nasopharynx was also patent without mass or lesion. The base of tongue was visualized and was normal. There were no signs of pooling of secretions in the piriform sinuses. The true vocal folds were mobile bilaterally. There were no  signs of glottic or supraglottic mucosal lesion or mass. There was moderate interarytenoid pachydermia and post cricoid edema. The telescope was then slowly withdrawn and the patient tolerated the procedure throughout.   Studies Reviewed: CXR done in June 2024 - negative   Assessment/Plan: Encounter Diagnoses  Name Primary?   Hemoptysis Yes   History of smoking    Gastroesophageal reflux disease without esophagitis    Nasal septal deviation    Nasal congestion    Tonsillar cyst    72 year old female with significant history of smoking who quit 15 years ago but previously smoked 1 to 2 packs/day for a few decades, no prior chest imaging for lung cancer screening aside from recently performed negative chest x-ray, who is here for an episode of hemoptysis, which self resolved.  She denies history of nosebleeds not on blood thinner denies history of bleeding around her gums or gingiva, and does not think that her teeth or gums were the source of bleeding.  On my exam including flexible laryngoscopy and thorough oral exam with visual inspection and palpation I did not identify any mucosal lesions or tumors to suggest a potential source for the episode of hemoptysis.  She does have residual tonsillar tissue bilaterally but it is symmetric on exam and there is a small submucosal cyst on the right side which has benign appearance, and does not look as  a potential source.  She had no blood or clot around laryngeal structures or hypopharynx and no masses or tumors.  She denies dyspnea dyspnea dysphagia or acute voice changes to me today but does report persistent dry cough she has had for a long time.  Due to risk factors for pulmonary source of bleeding and unclear source on my exam will order CT neck and chest and sent her to see pulmonary for a consultation.  I encouraged the patient to seek care in the ER if she has another episode of hemoptysis to expedite workup and imaging.  She will return after imaging  to review the results and consider consultation with GI if everything is negative, since I suspect GI source as another source of hemoptysis although less likely based on the history alone.   - schedule CT neck and chest with contrast - schedule Pulmonary consultation  - return after consultation and imaging -Continue OTC nasal spray and antihistamine for nasal congestion -Diet and lifestyle changes to minimize GERD LPR  Thank you for allowing me to participate in the care of this patient. Please do not hesitate to contact me with any questions or concerns.   Ashok Croon, MD Otolaryngology [facility] Phone: [phone] Fax: [phone]    01/05/2023, 5:20 PM

## 2023-01-05 NOTE — Patient Instructions (Signed)
-   schedule CT neck and chest - schedule Pulmonary consultation  - return after consultation and imaging

## 2023-01-06 DIAGNOSIS — R5383 Other fatigue: Secondary | ICD-10-CM | POA: Diagnosis not present

## 2023-01-06 DIAGNOSIS — E559 Vitamin D deficiency, unspecified: Secondary | ICD-10-CM | POA: Diagnosis not present

## 2023-01-06 DIAGNOSIS — M810 Age-related osteoporosis without current pathological fracture: Secondary | ICD-10-CM | POA: Diagnosis not present

## 2023-01-07 ENCOUNTER — Other Ambulatory Visit: Payer: Self-pay

## 2023-01-10 ENCOUNTER — Telehealth: Payer: Self-pay | Admitting: Pharmacy Technician

## 2023-01-10 NOTE — Telephone Encounter (Signed)
Auth Submission: NO AUTH NEEDED Site of care: Site of care: [facility] Payer: [employer] Medication & CPT/J Code(s) submitted: Reclast (Zolendronic acid) [id number] Route of submission (phone, fax, portal):  Phone # Fax # Auth type: Buy/Bill Units/visits requested: 1 Reference number:  Approval from: 01/10/23 to 06/21/23

## 2023-01-25 ENCOUNTER — Ambulatory Visit (INDEPENDENT_AMBULATORY_CARE_PROVIDER_SITE_OTHER): Payer: PPO

## 2023-01-25 VITALS — BP 118/76 | HR 59 | Temp 97.6°F | Resp 16 | Ht 64.0 in | Wt 126.2 lb

## 2023-01-25 DIAGNOSIS — M810 Age-related osteoporosis without current pathological fracture: Secondary | ICD-10-CM | POA: Diagnosis not present

## 2023-01-25 MED ORDER — ACETAMINOPHEN 325 MG PO TABS
650.0000 mg | ORAL_TABLET | Freq: Once | ORAL | Status: AC
  Administered 2023-01-25: 650 mg via ORAL
  Filled 2023-01-25: qty 2

## 2023-01-25 MED ORDER — SODIUM CHLORIDE 0.9 % IV SOLN: INTRAVENOUS | Status: DC

## 2023-01-25 MED ORDER — ZOLEDRONIC ACID 5 MG/100ML IV SOLN
5.0000 mg | Freq: Once | INTRAVENOUS | Status: AC
  Administered 2023-01-25: 5 mg via INTRAVENOUS
  Filled 2023-01-25: qty 100

## 2023-01-25 MED ORDER — DIPHENHYDRAMINE HCL 25 MG PO CAPS
25.0000 mg | ORAL_CAPSULE | Freq: Once | ORAL | Status: AC
  Administered 2023-01-25: 25 mg via ORAL
  Filled 2023-01-25: qty 1

## 2023-01-25 NOTE — Progress Notes (Signed)
Diagnosis: Osteoporosis  Provider:  Chilton Greathouse MD  Procedure: IV Infusion  IV Type: Peripheral, IV Location: L Forearm  Reclast (Zolendronic Acid), Dose: 5 mg  Infusion Start Time: 1103  Infusion Stop Time: 1128  Post Infusion IV Care: Observation period completed. PIV removed  Discharge: Condition: Good, Destination: Home . AVS Declined  Performed by:  Rico Ala, LPN

## 2023-02-03 ENCOUNTER — Ambulatory Visit (HOSPITAL_COMMUNITY)
Admission: RE | Admit: 2023-02-03 | Discharge: 2023-02-03 | Disposition: A | Discharge status: Home / Self Care | Payer: PPO | Source: Ambulatory Visit | Attending: Otolaryngology | Admitting: Otolaryngology

## 2023-02-03 ENCOUNTER — Encounter (HOSPITAL_COMMUNITY): Payer: Self-pay

## 2023-02-03 ENCOUNTER — Ambulatory Visit (HOSPITAL_COMMUNITY): Payer: PPO

## 2023-02-03 DIAGNOSIS — R042 Hemoptysis: Secondary | ICD-10-CM | POA: Diagnosis not present

## 2023-02-03 DIAGNOSIS — R918 Other nonspecific abnormal finding of lung field: Secondary | ICD-10-CM | POA: Diagnosis not present

## 2023-02-03 DIAGNOSIS — R911 Solitary pulmonary nodule: Secondary | ICD-10-CM | POA: Diagnosis not present

## 2023-02-03 MED ORDER — SODIUM CHLORIDE (PF) 0.9 % IJ SOLN
INTRAMUSCULAR | Status: AC
  Filled 2023-02-03: qty 50

## 2023-02-03 MED ORDER — IOHEXOL 300 MG/ML  SOLN
75.0000 mL | Freq: Once | INTRAMUSCULAR | Status: AC | PRN
  Administered 2023-02-03: 75 mL via INTRAVENOUS

## 2023-03-09 ENCOUNTER — Ambulatory Visit (INDEPENDENT_AMBULATORY_CARE_PROVIDER_SITE_OTHER): Payer: PPO | Admitting: Otolaryngology

## 2023-03-09 ENCOUNTER — Telehealth (INDEPENDENT_AMBULATORY_CARE_PROVIDER_SITE_OTHER): Payer: Self-pay | Admitting: Otolaryngology

## 2023-03-09 NOTE — Telephone Encounter (Signed)
Patient called.  She spoke with [facility] and they told her she did not need to see them since she has already had a CT Scan and it looked stable.  Patient already has a gastro dr.  Alfonzo Beers was cancelled today because she was supposed to go to pulmonologist first.   Please advise if she needs to schedule another follow up appt  or if there are other instructions.  She can be reached at [phone]

## 2023-03-09 NOTE — Telephone Encounter (Signed)
Misty Stanley was able to open some availability with Dr. Allena Katz on Sep 27th

## 2023-03-28 DIAGNOSIS — E7800 Pure hypercholesterolemia, unspecified: Secondary | ICD-10-CM | POA: Diagnosis not present

## 2023-03-28 DIAGNOSIS — R519 Headache, unspecified: Secondary | ICD-10-CM | POA: Diagnosis not present

## 2023-03-28 DIAGNOSIS — Z79899 Other long term (current) drug therapy: Secondary | ICD-10-CM | POA: Diagnosis not present

## 2023-03-28 DIAGNOSIS — L723 Sebaceous cyst: Secondary | ICD-10-CM | POA: Diagnosis not present

## 2023-03-28 DIAGNOSIS — Z1211 Encounter for screening for malignant neoplasm of colon: Secondary | ICD-10-CM | POA: Diagnosis not present

## 2023-03-28 DIAGNOSIS — R7303 Prediabetes: Secondary | ICD-10-CM | POA: Diagnosis not present

## 2023-03-28 DIAGNOSIS — M818 Other osteoporosis without current pathological fracture: Secondary | ICD-10-CM | POA: Diagnosis not present

## 2023-04-04 DIAGNOSIS — Z1211 Encounter for screening for malignant neoplasm of colon: Secondary | ICD-10-CM | POA: Diagnosis not present

## 2023-04-05 ENCOUNTER — Ambulatory Visit (INDEPENDENT_AMBULATORY_CARE_PROVIDER_SITE_OTHER): Payer: PPO | Admitting: Otolaryngology

## 2023-04-05 ENCOUNTER — Encounter (INDEPENDENT_AMBULATORY_CARE_PROVIDER_SITE_OTHER): Payer: Self-pay | Admitting: Otolaryngology

## 2023-04-05 VITALS — BP 134/72 | HR 90

## 2023-04-05 DIAGNOSIS — R0981 Nasal congestion: Secondary | ICD-10-CM | POA: Diagnosis not present

## 2023-04-05 DIAGNOSIS — Z87891 Personal history of nicotine dependence: Secondary | ICD-10-CM

## 2023-04-05 DIAGNOSIS — K219 Gastro-esophageal reflux disease without esophagitis: Secondary | ICD-10-CM | POA: Diagnosis not present

## 2023-04-05 DIAGNOSIS — J358 Other chronic diseases of tonsils and adenoids: Secondary | ICD-10-CM

## 2023-04-05 DIAGNOSIS — J342 Deviated nasal septum: Secondary | ICD-10-CM

## 2023-04-05 MED ORDER — FLUTICASONE PROPIONATE 50 MCG/ACT NA SUSP: 2.0000 | Freq: Every day | NASAL | 6 refills | Status: AC

## 2023-04-05 MED ORDER — CETIRIZINE HCL 10 MG PO TABS: 10.0000 mg | ORAL_TABLET | Freq: Every day | ORAL | 11 refills | Status: AC

## 2023-04-05 NOTE — Progress Notes (Signed)
ENT Progress Note   Update 04/05/23 She denies any further episodes of hemoptysis. Not on antihistamine or nasal spray currently. She has nasal congestion mostly in the Fall. She clears her throat and blows her nose a lot. She did not have a consult with Pulm because after imaging review, Pulmonary did not feel that she needed to be seen.  Initial evaluation 01/05/2023  Reason for Consult: hemoptysis    HPI: Mindy Mann is an 72 y.o. female former smoker, quit 15 yrs ago, used to smoke 2 PPD, here for an episode of oral bleeding/hemoptysis.   She was brushing her teeth and all of a sudden started to spit up blood. She did not see any evidence of blood on the tooth brush, denies dental problems and does not think the source of bleeding was her teeth, gums. She feels that she spit up a blood clot and saliva (quarter size) and it was bright red. Then she rinsed her mouth and it stopped. She is not on blood thinner. Denies pain in her throat or in her mouth. She has dry cough all the time, and has had it for a while. She has had sinus infections and last one was last Fall. She was sick and then was coughing a lot. She has osteoporosis takes vitamin K and also takes Peppermint oil pills and probiotic. She is on calcium supplement/Vitamin D. Denies hx of lung disease, was a smoker in the past quit 15-16 yrs ago. She used to smoke 1-2 packs per day before. She drinks 1-2 glasses of wine a day. No cancer hx. had a negative chest x-ray in July of this year but never had CT of her chest for cancer screening.  Records Reviewed:  Had CXR 12/21/2022   FINDINGS: Cardiac silhouette is unremarkable. No pneumothorax or pleural effusion. The lungs are clear. There are thoracic degenerative changes.   IMPRESSION: No acute cardiopulmonary process.    Past Medical History:  Diagnosis Date   Cancer (HCC)    suspicous skin cells   Chest pain    Depression    Headache    mirgaines   History of kidney  stones    Sinusitis     Past Surgical History:  Procedure Laterality Date   BREAST BIOPSY     KNEE SURGERY     ROTATOR CUFF REPAIR      Family History  Problem Relation Age of Onset   Heart attack Father 19   Heart murmur Sister        vale replacement    Social History:  reports that she quit smoking about 6 years ago. Her smoking use included cigarettes. She has never used smokeless tobacco. She reports current alcohol use of about 10.0 standard drinks of alcohol per week. She reports that she does not use drugs.  Allergies: Not on File  Medications: I have reviewed the patient's current medications.  The PMH, PSH, Medications, Allergies, and SH were reviewed and updated.  ROS: Constitutional: Negative for fever, weight loss and weight gain. Cardiovascular: Negative for chest pain and dyspnea on exertion. Respiratory: Is not experiencing shortness of breath at rest. Gastrointestinal: Negative for nausea and vomiting. Neurological: Negative for headaches. Psychiatric: The patient is not nervous/anxiou  Blood pressure 134/72, pulse 90, SpO2 95%.  PHYSICAL EXAM:  Exam: General: Well-developed, well-nourished Communication and Voice: Mildly raspy Respiratory Respiratory effort: Equal inspiration and expiration without stridor Cardiovascular Peripheral Vascular: Warm extremities with equal color/perfusion Eyes: No nystagmus with equal extraocular motion bilaterally  Neuro/Psych/Balance: Patient oriented to person, place, and time; Appropriate mood and affect; Gait is intact with no imbalance; Cranial nerves I-XII are intact Head and Face Inspection: Normocephalic and atraumatic without mass or lesion Palpation: Facial skeleton intact without bony stepoffs Salivary Glands: No mass or tenderness Facial Strength: Facial motility symmetric and full bilaterally ENT Pinna: External ear intact and fully developed External canal: Canal is patent with intact skin Tympanic  Membrane: Clear and mobile External Nose: No scar or anatomic deformity Internal Nose: Septum intact and midline. No edema, polyp, or rhinorrhea Lips, Teeth, and gums: Mucosa and teeth intact and viable TMJ: No pain to palpation with full mobility Oral cavity/oropharynx: No erythema or exudate, no lesions present.  Scant residual tonsillar tissue present with a small tonsillar cyst on the right upper aspect of her throat.  No other lesions or mucosal changes.  No blood or clot or regular appearing mucosa. Nasopharynx: No mass or lesion with intact mucosa Hypopharynx: Intact mucosa without pooling of secretions Larynx Glottic: Full true vocal cord mobility without lesion or mass Supraglottic: Normal appearing epiglottis and AE folds.  Slightly prominent vasculature along the superior aspect of epiglottis but no blood or clot or active bleeding or mucosal irregularities Interarytenoid Space: No or minimal pachydermia or edema Subglottic Space: Patent without lesion or edema Neck Neck and Trachea: Midline trachea without mass or lesion Thyroid: No mass or nodularity Lymphatics: No lymphadenopathy  Procedure done during last office visit on 01/05/2023: Preoperative diagnosis: hemoptysis   Postoperative diagnosis:   Same  Procedure: Flexible fiberoptic laryngoscopy  Surgeon: Ashok Croon, MD  Anesthesia: Topical lidocaine and Afrin Complications: None Condition is stable throughout exam  Indications and consent:  The patient presents to the clinic with Indirect laryngoscopy view was incomplete. Thus it was recommended that they undergo a flexible fiberoptic laryngoscopy. All of the risks, benefits, and potential complications were reviewed with the patient preoperatively and verbal informed consent was obtained.  Procedure: The patient was seated upright in the clinic. Topical lidocaine and Afrin were applied to the nasal cavity. After adequate anesthesia had occurred, I then proceeded  to pass the flexible telescope into the nasal cavity. The nasal cavity was patent without rhinorrhea or polyp. The nasopharynx was also patent without mass or lesion. The base of tongue was visualized and was normal. There were no signs of pooling of secretions in the piriform sinuses. The true vocal folds were mobile bilaterally. There were no signs of glottic or supraglottic mucosal lesion or mass. There was moderate interarytenoid pachydermia and post cricoid edema. The telescope was then slowly withdrawn and the patient tolerated the procedure throughout.   Studies Reviewed: CXR done in June 2024 - negative   CT chest 02/03/23 IMPRESSION: No findings suspicious for malignancy.   8 mm left lower lobe pulmonary nodule, unchanged from 2017, benign.   Additional scattered small bilateral pulmonary nodules, measuring 2-4 mm, likely benign. No follow-up is recommended per Fleischner Society guidelines.  CT neck 02/03/23 IMPRESSION: No acute abnormality in the neck.  No finding to explain hemoptysis    Assessment/Plan: Encounter Diagnoses  Name Primary?   Nasal congestion Yes   Nasal septal deviation    Tonsillar cyst    History of smoking    Gastroesophageal reflux disease without esophagitis     72 year old female with significant history of smoking who quit 15 years ago but previously smoked 1 to 2 packs/day for a few decades, no prior chest imaging for lung cancer screening aside from  recently performed negative chest x-ray, who is here for an episode of hemoptysis, which self resolved.  She denies history of nosebleeds not on blood thinner denies history of bleeding around her gums or gingiva, and does not think that her teeth or gums were the source of bleeding.  On my exam including flexible laryngoscopy and thorough oral exam with visual inspection and palpation I did not identify any mucosal lesions or tumors to suggest a potential source for the episode of hemoptysis.  She does  have residual tonsillar tissue bilaterally but it is symmetric on exam and there is a small submucosal cyst on the right side which has benign appearance, and does not look as a potential source.  She had no blood or clot around laryngeal structures or hypopharynx and no masses or tumors.  She denies dyspnea dyspnea dysphagia or acute voice changes to me today but does report persistent dry cough she has had for a long time.  Due to risk factors for pulmonary source of bleeding and unclear source on my exam will order CT neck and chest and sent her to see pulmonary for a consultation.  I encouraged the patient to seek care in the ER if she has another episode of hemoptysis to expedite workup and imaging.  She will return after imaging to review the results and consider consultation with GI if everything is negative, since I suspect GI source as another source of hemoptysis although less likely based on the history alone.   - schedule CT neck and chest with contrast - schedule Pulmonary consultation  - return after consultation and imaging -Continue OTC nasal spray and antihistamine for nasal congestion -Diet and lifestyle changes to minimize GERD LPR  Update 04/05/23 she reports hemoptysis resolved.  She had CT neck and chest.  CT neck was unremarkable, CT chest showed 8 mm left lower lobe nodule stable from 2017 with additional scattered bilateral pulmonary nodules 2 to 4 mm likely benign with no follow-up needed based on guidelines.  He was told he does not need to see pulmonary after getting scans.  Already established with GI and sees GI for history of IBS.  Hemoptysis -resolved -I discussed imaging findings with the patient and explained that they were overall reassuring  2.  Nasal congestion suspected environmental allergies -Resume Zyrtec 10 mg daily and Flonase 2 puffs bilateral nares twice daily  3.  GERD LPR -Diet and lifestyle changes to minimize reflux -Trial of reflux Gourmet  RTC  prn     Thank you for allowing me to participate in the care of this patient. Please do not hesitate to contact me with any questions or concerns.   Ashok Croon, MD Otolaryngology [facility] Phone: [phone] Fax: [phone]    04/05/2023, 8:38 PM

## 2023-04-05 NOTE — Patient Instructions (Signed)
-   try reflux gourmet - diet and lifestyle changes to minimize reflux  - Flonase and Zyrtec for seasonal allergies  - return as needed   - Take Reflux Gourmet (natural supplement available on [employer]) to help with symptoms of chronic throat irritation

## 2023-04-16 DIAGNOSIS — N3001 Acute cystitis with hematuria: Secondary | ICD-10-CM | POA: Diagnosis not present

## 2023-05-10 DIAGNOSIS — Z1231 Encounter for screening mammogram for malignant neoplasm of breast: Secondary | ICD-10-CM | POA: Diagnosis not present

## 2023-05-10 DIAGNOSIS — Z124 Encounter for screening for malignant neoplasm of cervix: Secondary | ICD-10-CM | POA: Diagnosis not present

## 2023-05-10 DIAGNOSIS — Z6821 Body mass index (BMI) 21.0-21.9, adult: Secondary | ICD-10-CM | POA: Diagnosis not present

## 2023-05-31 DIAGNOSIS — N930 Postcoital and contact bleeding: Secondary | ICD-10-CM | POA: Diagnosis not present

## 2023-07-12 ENCOUNTER — Encounter: Payer: Self-pay | Admitting: Diagnostic Neuroimaging

## 2023-07-12 ENCOUNTER — Ambulatory Visit: Payer: PPO | Admitting: Diagnostic Neuroimaging

## 2023-07-12 VITALS — BP 141/72 | HR 72 | Ht 64.0 in | Wt 128.0 lb

## 2023-07-12 DIAGNOSIS — R4189 Other symptoms and signs involving cognitive functions and awareness: Secondary | ICD-10-CM

## 2023-07-12 DIAGNOSIS — R519 Headache, unspecified: Secondary | ICD-10-CM

## 2023-07-12 DIAGNOSIS — G43109 Migraine with aura, not intractable, without status migrainosus: Secondary | ICD-10-CM

## 2023-07-12 NOTE — Patient Instructions (Addendum)
  LEFT SIDED HEADACHES (since Oct 2024) - non-specific, brief sensations; possible occipital neuralgia; now resolved  HISTORY OF MIGRAINE WITH AURA - continue rizatriptan as needed  MILD COGNITIVE CHANGES - check MRI brain - stay active physically - eat a nutritious diet with lean protein, plants / vegetables, whole grains; avoid ultra-processed foods - increase social activities, brain stimulation, games, puzzles, hobbies, crafts, arts, music; try new activities; keep it fun! - aim for at least 7-8 hours sleep per night (or more) - avoid smoking and alcohol - caution with medications, finances, driving - safety / supervision issues reviewed

## 2023-07-12 NOTE — Progress Notes (Signed)
[facility]  PATIENT: Mindy Mann DOB: [birth year]  REFERRING CLINICIAN: Mila Palmer, MD HISTORY FROM: patient  REASON FOR VISIT: new consult   HISTORICAL  CHIEF COMPLAINT:  Chief Complaint  Patient presents with   New Patient (Initial Visit)    Pt in 6 alone Pt states here for sharp pains on left side of head Pt states has been two weeks since last episode     HISTORY OF PRESENT ILLNESS:     73 year old female here for evaluation of left-sided headaches.  Since October 2024 has had sharp stabbing fleeting flashes of pain in the left parietal region.  Sometimes on the right side.  These were occurring on a daily basis a few times a day.  No specific triggering or aggravating factors.  Symptoms spontaneously resolved about 2 weeks ago.  Does have some history of remote migraines with nausea and visual aura.  No current migraines.   REVIEW OF SYSTEMS: Full 14 system review of systems performed and negative with exception of: as per HPI.  ALLERGIES: No Known Allergies  HOME MEDICATIONS: Outpatient Medications Prior to Visit  Medication Sig Dispense Refill   ARTIFICIAL TEAR SOLUTION OP Place 1 drop into both eyes daily as needed (dry eyes).     Caraway Oil-Levomenthol (FDGARD PO) Take 2 tablets by mouth daily.     cetirizine (ZYRTEC) 10 MG tablet Take 1 tablet (10 mg total) by mouth daily. 30 tablet 11   Cholecalciferol (VITAMIN D) 50 MCG (2000 UT) tablet Take 2,000 Units by mouth daily.     fluticasone (FLONASE) 50 MCG/ACT nasal spray Place 2 sprays into both nostrils daily. 16 g 6   ibuprofen (ADVIL) 200 MG tablet Take 800 mg by mouth every 6 (six) hours as needed for moderate pain.     Menatetrenone (VITAMIN K2) 100 MCG TABS Take 100 mcg by mouth daily.     Multiple Vitamins-Minerals (ALGAE BASED CALCIUM) TABS Take 4 tablets by mouth at bedtime.     mupirocin ointment (BACTROBAN) 2 % Apply 1 Application topically at bedtime.     Peppermint Oil  (IBGARD PO) Take 2 tablets by mouth every evening.     Probiotic Product (PROBIOTIC PO) Take 1 capsule by mouth 2 (two) times daily.     rizatriptan (MAXALT-MLT) 10 MG disintegrating tablet Take 10 mg by mouth as needed for migraine. May repeat in 2 hours if needed     sertraline (ZOLOFT) 50 MG tablet Take 75 mg by mouth daily.     valACYclovir (VALTREX) 500 MG tablet Take 500 mg by mouth 2 (two) times daily as needed (fever blisters).     No facility-administered medications prior to visit.    PAST MEDICAL HISTORY: Past Medical History:  Diagnosis Date   Cancer (HCC)    suspicous skin cells   Chest pain    Depression    Headache    mirgaines   History of kidney stones    Sinusitis     PAST SURGICAL HISTORY: Past Surgical History:  Procedure Laterality Date   BREAST BIOPSY     KNEE SURGERY     ROTATOR CUFF REPAIR      FAMILY HISTORY: Family History  Problem Relation Age of Onset   Heart attack Father 34   Heart murmur Sister        vale replacement   Migraines Sister     SOCIAL HISTORY: Social History   Socioeconomic History   Marital status: Widowed    Spouse name:  Not on file   Number of children: Not on file   Years of education: Not on file   Highest education level: Not on file  Occupational History   Not on file  Tobacco Use   Smoking status: Former    Current packs/day: 0.00    Types: Cigarettes    Quit date: 2018    Years since quitting: 7.0   Smokeless tobacco: Never  Vaping Use   Vaping status: Never Used  Substance and Sexual Activity   Alcohol use: Yes    Alcohol/week: 14.0 standard drinks of alcohol    Types: 14 Glasses of wine per week   Drug use: No   Sexual activity: Not on file  Other Topics Concern   Not on file  Social History Narrative   Pt lives alone    Retired    Chief Executive Officer [employer] investment banker Strain: Not on file  Food Insecurity: Not on file  Transportation Needs: Not on file  Physical Activity: Not on  file  Stress: Not on file  Social Connections: Not on file  Intimate Partner Violence: Not on file     PHYSICAL EXAM  GENERAL EXAM/CONSTITUTIONAL: Vitals:  Vitals:   07/12/23 1057  BP: (!) 141/72  Pulse: 72  Weight: 128 lb (58.1 kg)  Height: 5\' 4"  (1.626 m)   Body mass index is 21.97 kg/m. Wt Readings from Last 3 Encounters:  07/12/23 128 lb (58.1 kg)  01/25/23 126 lb 3.2 oz (57.2 kg)  01/05/23 124 lb (56.2 kg)   Patient is in no distress; well developed, nourished and groomed; neck is supple  CARDIOVASCULAR: Examination of carotid arteries is normal; no carotid bruits Regular rate and rhythm, no murmurs Examination of peripheral vascular system by observation and palpation is normal  EYES: Ophthalmoscopic exam of optic discs and posterior segments is normal; no papilledema or hemorrhages No results found.  MUSCULOSKELETAL: Gait, strength, tone, movements noted in Neurologic exam below  NEUROLOGIC: MENTAL STATUS:      No data to display         awake, alert, oriented to person, place and time recent and remote memory intact normal attention and concentration language fluent, comprehension intact, naming intact fund of knowledge appropriate  CRANIAL NERVE:  2nd - no papilledema on fundoscopic exam 2nd, 3rd, 4th, 6th - pupils equal and reactive to light, visual fields full to confrontation, extraocular muscles intact, no nystagmus 5th - facial sensation symmetric 7th - facial strength symmetric 8th - hearing intact 9th - palate elevates symmetrically, uvula midline 11th - shoulder shrug symmetric 12th - tongue protrusion midline  MOTOR:  normal bulk and tone, full strength in the BUE, BLE  SENSORY:  normal and symmetric to light touch, temperature, vibration  COORDINATION:  finger-nose-finger, fine finger movements normal  REFLEXES:  deep tendon reflexes TRACE and symmetric  GAIT/STATION:  narrow based gait     DIAGNOSTIC DATA (LABS,  IMAGING, TESTING) - I reviewed patient records, labs, notes, testing and imaging myself where available.  Lab Results  Component Value Date   WBC 7.2 05/17/2016   HGB 14.0 05/17/2016   HCT 42.7 05/17/2016   MCV 96.0 05/17/2016   PLT 218 05/17/2016      Component Value Date/Time   NA 137 05/17/2016 2116   K 4.7 05/17/2016 2116   CL 104 05/17/2016 2116   CO2 23 05/17/2016 2116   GLUCOSE 142 (H) 05/17/2016 2116   BUN 15 05/17/2016 2116   CREATININE 1.02 (  H) 05/17/2016 2116   CALCIUM 9.1 05/17/2016 2116   PROT 6.8 05/17/2016 2116   ALBUMIN 4.4 05/17/2016 2116   AST 26 05/17/2016 2116   ALT 18 05/17/2016 2116   ALKPHOS 50 05/17/2016 2116   BILITOT 0.6 05/17/2016 2116   GFRNONAA 56 (L) 05/17/2016 2116   GFRAA >60 05/17/2016 2116   No results found for: "CHOL", "HDL", "LDLCALC", "LDLDIRECT", "TRIG", "CHOLHDL" No results found for: "HGBA1C" No results found for: "VITAMINB12" No results found for: "TSH"   ASSESSMENT AND PLAN  73 y.o. year old female here with:  Dx:  1. Left-sided headache   2. Migraine with aura and without status migrainosus, not intractable   3. Cognitive changes     PLAN:  LEFT SIDED HEADACHES (since Oct 2024) - non-specific, brief sensations; possible occipital neuralgia; now resolved  HISTORY OF MIGRAINE WITH AURA - continue rizatriptan as needed  MILD COGNITIVE CHANGES (no changes in ADLs; mild vision and hearing issues) - check MRI brain - stay active physically - eat a nutritious diet with lean protein, plants / vegetables, whole grains; avoid ultra-processed foods - increase social activities, brain stimulation, games, puzzles, hobbies, crafts, arts, music; try new activities; keep it fun! - aim for at least 7-8 hours sleep per night (or more) - avoid smoking and alcohol - caution with medications, finances, driving, living alone - safety / supervision issues reviewed  Orders Placed This Encounter  Procedures   MR BRAIN W WO CONTRAST    Return for pending if symptoms worsen or fail to improve, pending test results.    Suanne Marker, MD 07/12/2023, 11:21 AM Certified in Neurology, Neurophysiology and Neuroimaging  [facility] [address], [address] [place], [place] [address] [phone]

## 2023-07-14 ENCOUNTER — Encounter (INDEPENDENT_AMBULATORY_CARE_PROVIDER_SITE_OTHER): Payer: Self-pay | Admitting: Otolaryngology

## 2023-07-14 ENCOUNTER — Ambulatory Visit (INDEPENDENT_AMBULATORY_CARE_PROVIDER_SITE_OTHER): Payer: PPO | Admitting: Otolaryngology

## 2023-07-14 VITALS — BP 138/80 | HR 88

## 2023-07-14 DIAGNOSIS — H90A31 Mixed conductive and sensorineural hearing loss, unilateral, right ear with restricted hearing on the contralateral side: Secondary | ICD-10-CM | POA: Diagnosis not present

## 2023-07-14 DIAGNOSIS — H903 Sensorineural hearing loss, bilateral: Secondary | ICD-10-CM

## 2023-07-14 DIAGNOSIS — H918X3 Other specified hearing loss, bilateral: Secondary | ICD-10-CM

## 2023-07-14 DIAGNOSIS — H9193 Unspecified hearing loss, bilateral: Secondary | ICD-10-CM

## 2023-07-14 NOTE — Progress Notes (Signed)
ENT CONSULT:  Reason for Consult: decreased hearing  hx of SNHL wears hearing aids  HPI: Discussed the use of AI scribe software for clinical note transcription with the patient, who gave verbal consent to proceed.  History of Present Illness   The patient is a 22 yoF a hearing aid user for approximately four to five years, presents with a recent decline in hearing over the past six months. The patient sought to replace their current hearing aids due to this perceived decrease in auditory function. 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 The patient reports no previous issues with hearing aids. The patient does not recall any significant differences in hearing between the two ears and has not noticed any unilateral changes in hearing. The patient's current hearing aids were obtained from [facility] in [place]. The patient has no additional complaints or concerns at this time. No ear pain or drainage, no vertigo.      Past Medical History:  Diagnosis Date   Cancer (HCC)    suspicous skin cells   Chest pain    Depression    Headache    mirgaines   History of kidney stones    Sinusitis     Past Surgical History:  Procedure Laterality Date   BREAST BIOPSY     KNEE SURGERY     ROTATOR CUFF REPAIR      Family History  Problem Relation Age of Onset   Heart attack Father 48   Heart murmur Sister        vale replacement   Migraines Sister     Social History:  reports that she quit smoking about 7 years ago. Her smoking use included cigarettes. She has never used smokeless tobacco. She reports current alcohol use of about 14.0 standard drinks of alcohol per week. She reports that she does not use drugs.  Allergies: No Known Allergies  Medications: I have reviewed the patient's current  medications.  The PMH, PSH, Medications, Allergies, and SH were reviewed and updated.  ROS: Constitutional: Negative for fever, weight loss and weight gain. Cardiovascular: Negative for chest pain and dyspnea on exertion. Respiratory: Is not experiencing shortness of breath at rest. Gastrointestinal: Negative for nausea and vomiting. Neurological: Negative for headaches. Psychiatric: The patient is not nervous/anxious  Blood pressure 138/80, pulse 88, SpO2 97%.  PHYSICAL EXAM:  Exam: General: Well-developed, well-nourished Respiratory Respiratory effort: Equal inspiration and expiration without stridor Cardiovascular Peripheral Vascular: Warm extremities with equal color/perfusion Eyes: No nystagmus with equal extraocular motion bilaterally Neuro/Psych/Balance: Patient oriented to person, place, and time; Appropriate mood and affect; Gait is intact with no imbalance; Cranial nerves I-XII are intact Head and Face Inspection: Normocephalic and atraumatic without mass or lesion Palpation: Facial skeleton intact without bony stepoffs Salivary Glands: No mass or tenderness Facial Strength: Facial motility symmetric and full bilaterally ENT Pinna: External ear intact and fully developed External canal: Canal is patent with intact skin Tympanic Membrane: Clear and mobile External Nose: No scar or anatomic deformity Internal Nose: Septum intact and midline. No edema, polyp, or rhinorrhea Lips, Teeth, and gums: Mucosa and teeth intact and viable TMJ: No pain to palpation with full mobility Oral cavity/oropharynx: No erythema or exudate, no lesions present Neck Neck and Trachea: Midline trachea without mass or lesion Thyroid: No mass or  nodularity Lymphatics: No lymphadenopathy  Procedure: none Studies Reviewed: Audiogram performed at [facility] 07/08/23 Left ear - evidence of mild sloping to moderate and severe SNHL Right ear - evidence of air bone gap at 500K, 1K and mixed hearing  loss  WDS 100% on the right and 88% on the left (at 70-80 dB HL)  Assessment/Plan: Encounter Diagnoses  Name Primary?   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 Using hearing aids for four to five years. Recent hearing test at [facility] revealed an air-bone gap in the right ear, suggesting conductive component.  Will repeat the hearing test to confirm results and determine if imaging is necessary. Ear exam is unremarkable today without air-fluid level or evidence of infection. She denies any other sx such as dizziness/vertigo, ear pain or drainage. Explained that if the repeat test confirms conductive hearing loss, imaging will be required to identify the cause. If results show bilateral sensorineural hearing loss, referral to a hearing aid specialist/Audiologist for evaluation and adjustment of hearing aids will be made. Discussed the importance of accurate diagnosis before proceeding with new hearing aids. 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     Ashok Croon, MD Otolaryngology [facility] Phone: [phone] Fax: [phone]    07/14/2023, 9:12 AM

## 2023-07-28 ENCOUNTER — Ambulatory Visit: Payer: PPO | Attending: Otolaryngology | Admitting: Audiologist

## 2023-07-28 DIAGNOSIS — H903 Sensorineural hearing loss, bilateral: Secondary | ICD-10-CM | POA: Insufficient documentation

## 2023-07-28 NOTE — Procedures (Signed)
  [facility] [address] [place], [place]  [address] [phone]  AUDIOLOGICAL  EVALUATION  NAME: Mindy Mann     DOB:   1950/12/23      MRN: [id number]                                                                                     DATE: 07/28/2023     REFERENT: Verena Mems, MD STATUS: Outpatient DIAGNOSIS: Sensorineural Hearing Loss Bilateral    History: Mindy Mann was seen for an audiological evaluation due to concerns for possible asymmetric mixed hearing loss.  Mindy Mann denies pain, pressure, or tinnitus.  Mindy Mann no history of hazardous noise exposure.  Mindy Mann has aids currently from a [employer] dispenser. According to her, the aids have not been reprogrammed to account for changes in four years. Aids are currently 73 years old. The aids are not helpful in noisy situations. Speech does not sound clear even when wearing aids. 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   Evaluation:  Otoscopy showed a clear view of the tympanic membranes, bilaterally Tympanometry results were consistent with normal middle ear function, bilaterally   Audiometric testing was completed using Conventional Audiometry techniques with insert earphones and supraural headphones. Test results are consistent with sensorineural  sloping hearing loss. Right ear worse by 10-15dB 250-750Hz . Only conductive component was 15dB in the left ear at 750Hz  only. Speech Recognition Thresholds were obtained at 55dB HL in the right ear and at 45dB HL in the left ear. Word Recognition Testing was completed at  40dB SL and Mindy Mann scored 96% in the right ear and 100% in the left ear. Mindy Mann has good speech understanding when volume at sufficient levels.    Results:  The test results were reviewed with Mindy Mann. She does have a slight asymmetry in her hearing. She says this has always been present. Recommended she just monitor her hearing annually. The air bone  gap is not significant today. She did not bring the [employer] hearing test so we cannot compare. Her current hearing aids need to be updated to current thresholds. Real Ear testing recommended.  Audiogram printed and provided to Mindy Mann. Results from today shown below.      Recommendations: Hearing aids recommended for both ears. Patient given options of 1) go back to [employer] get new aids 2) go back to [facility] and request current aids be refit, possibly with power speakers and Real Ear measures, or 3) go to [facility] since they take hearing aids from other practices, have them refit the aids to this most recent test. Patient opted to go back to [facility]. Annual audiometric testing recommended to monitor hearing loss for progression of slight asymmetry.    42 minutes spent testing and counseling on results.   If you have any questions please feel free to contact me at [phone].  Lauraine Ka Au.D.  Audiologist   07/28/2023  3:16 PM  Cc: Verena Mems, MD

## 2023-07-30 ENCOUNTER — Ambulatory Visit
Admission: RE | Admit: 2023-07-30 | Discharge: 2023-07-30 | Disposition: A | Discharge status: Home / Self Care | Payer: PPO | Source: Ambulatory Visit | Attending: Diagnostic Neuroimaging | Admitting: Diagnostic Neuroimaging

## 2023-07-30 DIAGNOSIS — R4189 Other symptoms and signs involving cognitive functions and awareness: Secondary | ICD-10-CM | POA: Diagnosis not present

## 2023-07-30 DIAGNOSIS — R519 Headache, unspecified: Secondary | ICD-10-CM

## 2023-07-30 MED ORDER — GADOPICLENOL 0.5 MMOL/ML IV SOLN
6.0000 mL | Freq: Once | INTRAVENOUS | Status: AC | PRN
  Administered 2023-07-30: 6 mL via INTRAVENOUS

## 2023-08-02 ENCOUNTER — Telehealth: Payer: Self-pay | Admitting: Neurology

## 2023-08-02 DIAGNOSIS — G43109 Migraine with aura, not intractable, without status migrainosus: Secondary | ICD-10-CM

## 2023-08-02 DIAGNOSIS — R519 Headache, unspecified: Secondary | ICD-10-CM

## 2023-08-02 DIAGNOSIS — R4189 Other symptoms and signs involving cognitive functions and awareness: Secondary | ICD-10-CM

## 2023-08-02 NOTE — Telephone Encounter (Signed)
Called the patient and reviewed the MRI results with her. Advised this is likely a incidental finding but given her concerns of headaches Dr Marjory Lies will refer to neurosurgery for them to further evaluate and determine if anything else is needed. Pt verbalized understanding.

## 2023-08-02 NOTE — Telephone Encounter (Signed)
-----   Message from Glenford Bayley [facility] sent at 08/01/2023  2:06 PM EST ----- Small left frontal lesion out side of the brain (likely meningioma), also likely an incidental finding.  However due to left sided headaches and new symptoms, consider neurosurgery consult.  -VRP

## 2023-08-03 ENCOUNTER — Telehealth: Payer: Self-pay | Admitting: Diagnostic Neuroimaging

## 2023-08-03 NOTE — Telephone Encounter (Signed)
Referral sent to [facility] [phone]

## 2023-08-10 ENCOUNTER — Telehealth (INDEPENDENT_AMBULATORY_CARE_PROVIDER_SITE_OTHER): Payer: Self-pay

## 2023-08-10 NOTE — Telephone Encounter (Signed)
Patient stated she needs a copy of her audiogram signed by Dr Irene Pap so that she may get updated hearing aids from [facility].  I told her I would let Dr Irene Pap know and call when the documents are ready to be picked up

## 2023-08-17 ENCOUNTER — Telehealth: Payer: Self-pay | Admitting: Otolaryngology

## 2023-08-17 NOTE — Telephone Encounter (Signed)
 Yes she would need to bring in the form that needs filled out.

## 2023-08-17 NOTE — Telephone Encounter (Signed)
 So patient needs the original form sent by [employer] filled ut and signed and unfortunately the one that was filled out, they aren't accepting, should she bring another by and have it filled out and signed

## 2023-10-04 DIAGNOSIS — M5442 Lumbago with sciatica, left side: Secondary | ICD-10-CM | POA: Diagnosis not present

## 2023-12-15 DIAGNOSIS — E559 Vitamin D deficiency, unspecified: Secondary | ICD-10-CM | POA: Diagnosis not present

## 2023-12-15 DIAGNOSIS — R5383 Other fatigue: Secondary | ICD-10-CM | POA: Diagnosis not present

## 2023-12-15 DIAGNOSIS — M810 Age-related osteoporosis without current pathological fracture: Secondary | ICD-10-CM | POA: Diagnosis not present

## 2023-12-15 DIAGNOSIS — M791 Myalgia, unspecified site: Secondary | ICD-10-CM | POA: Diagnosis not present

## 2023-12-15 DIAGNOSIS — M47816 Spondylosis without myelopathy or radiculopathy, lumbar region: Secondary | ICD-10-CM | POA: Diagnosis not present

## 2023-12-15 DIAGNOSIS — M533 Sacrococcygeal disorders, not elsewhere classified: Secondary | ICD-10-CM | POA: Diagnosis not present

## 2023-12-19 ENCOUNTER — Other Ambulatory Visit: Payer: Self-pay | Admitting: Sports Medicine

## 2023-12-19 ENCOUNTER — Telehealth: Payer: Self-pay

## 2023-12-19 NOTE — Telephone Encounter (Signed)
 Auth Submission: NO AUTH NEEDED Site of care: Site of care: [facility] Payer: [employer] Medication & CPT/J Code(s) submitted: Reclast  (Zolendronic acid) J3489 Diagnosis Code:  Route of submission (phone, fax, portal):  Phone # Fax # Auth type: Buy/Bill PB Units/visits requested: 5mg  x 1 dose Reference number:  Approval from: 12/19/23 to 06/20/24

## 2024-01-25 ENCOUNTER — Ambulatory Visit

## 2024-01-25 VITALS — BP 128/76 | HR 61 | Temp 97.7°F | Resp 18 | Ht 64.0 in | Wt 123.0 lb

## 2024-01-25 DIAGNOSIS — M810 Age-related osteoporosis without current pathological fracture: Secondary | ICD-10-CM

## 2024-01-25 MED ORDER — ACETAMINOPHEN 325 MG PO TABS
650.0000 mg | ORAL_TABLET | Freq: Once | ORAL | Status: AC
  Administered 2024-01-25: 650 mg via ORAL
  Filled 2024-01-25: qty 2

## 2024-01-25 MED ORDER — DIPHENHYDRAMINE HCL 25 MG PO CAPS
25.0000 mg | ORAL_CAPSULE | Freq: Once | ORAL | Status: AC
  Administered 2024-01-25: 25 mg via ORAL
  Filled 2024-01-25: qty 1

## 2024-01-25 MED ORDER — ZOLEDRONIC ACID 5 MG/100ML IV SOLN
5.0000 mg | Freq: Once | INTRAVENOUS | Status: AC
  Administered 2024-01-25: 5 mg via INTRAVENOUS
  Filled 2024-01-25: qty 100

## 2024-01-25 MED ORDER — SODIUM CHLORIDE 0.9 % IV SOLN: INTRAVENOUS | Status: DC

## 2024-01-25 NOTE — Progress Notes (Signed)
 Diagnosis: Osteoporosis  Provider:  Lonna Coder MD  Procedure: IV Infusion  IV Type: Peripheral, IV Location: R Forearm  Reclast  (Zolendronic Acid), Dose: 5 mg  Infusion Start Time: 1151  Infusion Stop Time: 1221  Post Infusion IV Care: Observation period completed and Peripheral IV Discontinued  Discharge: Condition: Good, Destination: Home . AVS Declined  Performed by:  Maximiano JONELLE Pouch, LPN

## 2024-02-15 DIAGNOSIS — D329 Benign neoplasm of meninges, unspecified: Secondary | ICD-10-CM | POA: Diagnosis not present

## 2024-02-15 DIAGNOSIS — D320 Benign neoplasm of cerebral meninges: Secondary | ICD-10-CM | POA: Diagnosis not present

## 2024-04-03 DIAGNOSIS — Z0000 Encounter for general adult medical examination without abnormal findings: Secondary | ICD-10-CM | POA: Diagnosis not present

## 2024-04-03 DIAGNOSIS — Z136 Encounter for screening for cardiovascular disorders: Secondary | ICD-10-CM | POA: Diagnosis not present

## 2024-04-03 DIAGNOSIS — Z789 Other specified health status: Secondary | ICD-10-CM | POA: Diagnosis not present

## 2024-04-03 DIAGNOSIS — M818 Other osteoporosis without current pathological fracture: Secondary | ICD-10-CM | POA: Diagnosis not present

## 2024-04-03 DIAGNOSIS — B001 Herpesviral vesicular dermatitis: Secondary | ICD-10-CM | POA: Diagnosis not present

## 2024-04-03 DIAGNOSIS — E538 Deficiency of other specified B group vitamins: Secondary | ICD-10-CM | POA: Diagnosis not present

## 2024-04-03 DIAGNOSIS — E7800 Pure hypercholesterolemia, unspecified: Secondary | ICD-10-CM | POA: Diagnosis not present

## 2024-04-03 DIAGNOSIS — M810 Age-related osteoporosis without current pathological fracture: Secondary | ICD-10-CM | POA: Diagnosis not present

## 2024-04-03 DIAGNOSIS — F32A Depression, unspecified: Secondary | ICD-10-CM | POA: Diagnosis not present

## 2024-04-03 DIAGNOSIS — D329 Benign neoplasm of meninges, unspecified: Secondary | ICD-10-CM | POA: Diagnosis not present

## 2024-04-03 DIAGNOSIS — H00014 Hordeolum externum left upper eyelid: Secondary | ICD-10-CM | POA: Diagnosis not present

## 2024-04-03 DIAGNOSIS — L723 Sebaceous cyst: Secondary | ICD-10-CM | POA: Diagnosis not present

## 2024-04-19 DIAGNOSIS — D7589 Other specified diseases of blood and blood-forming organs: Secondary | ICD-10-CM | POA: Diagnosis not present

## 2024-04-19 DIAGNOSIS — M810 Age-related osteoporosis without current pathological fracture: Secondary | ICD-10-CM | POA: Diagnosis not present

## 2024-04-19 DIAGNOSIS — R5383 Other fatigue: Secondary | ICD-10-CM | POA: Diagnosis not present

## 2024-05-14 DIAGNOSIS — N958 Other specified menopausal and perimenopausal disorders: Secondary | ICD-10-CM | POA: Diagnosis not present

## 2024-05-14 DIAGNOSIS — Z6822 Body mass index (BMI) 22.0-22.9, adult: Secondary | ICD-10-CM | POA: Diagnosis not present

## 2024-05-14 DIAGNOSIS — M816 Localized osteoporosis [Lequesne]: Secondary | ICD-10-CM | POA: Diagnosis not present

## 2024-05-14 DIAGNOSIS — Z124 Encounter for screening for malignant neoplasm of cervix: Secondary | ICD-10-CM | POA: Diagnosis not present

## 2024-05-14 DIAGNOSIS — Z1231 Encounter for screening mammogram for malignant neoplasm of breast: Secondary | ICD-10-CM | POA: Diagnosis not present

## 2024-05-15 ENCOUNTER — Other Ambulatory Visit: Payer: Self-pay | Admitting: Obstetrics and Gynecology

## 2024-05-15 DIAGNOSIS — Z8249 Family history of ischemic heart disease and other diseases of the circulatory system: Secondary | ICD-10-CM

## 2024-05-24 ENCOUNTER — Ambulatory Visit
Admission: RE | Admit: 2024-05-24 | Discharge: 2024-05-24 | Disposition: A | Source: Ambulatory Visit | Attending: Obstetrics and Gynecology | Admitting: Obstetrics and Gynecology

## 2024-05-24 DIAGNOSIS — Z8249 Family history of ischemic heart disease and other diseases of the circulatory system: Secondary | ICD-10-CM

## 2024-05-25 ENCOUNTER — Encounter (INDEPENDENT_AMBULATORY_CARE_PROVIDER_SITE_OTHER): Payer: Self-pay | Admitting: Physician Assistant

## 2024-05-25 ENCOUNTER — Ambulatory Visit (INDEPENDENT_AMBULATORY_CARE_PROVIDER_SITE_OTHER): Admitting: Physician Assistant

## 2024-05-25 ENCOUNTER — Telehealth (INDEPENDENT_AMBULATORY_CARE_PROVIDER_SITE_OTHER): Payer: Self-pay

## 2024-05-25 VITALS — BP 127/76 | HR 83

## 2024-05-25 DIAGNOSIS — H6122 Impacted cerumen, left ear: Secondary | ICD-10-CM

## 2024-05-25 DIAGNOSIS — H9222 Otorrhagia, left ear: Secondary | ICD-10-CM

## 2024-05-25 NOTE — Progress Notes (Signed)
 Dear Dr. Verena, Here is my assessment for our mutual patient, Fran Gillingham. Thank you for allowing me the opportunity to care for your patient. Please do not hesitate to contact me should you have any other questions. Sincerely, Chyrl Cohen PA-C  Otolaryngology Clinic Note Referring provider: Dr. Verena HPI:  Ekta Doten is a 73 y.o. female kindly referred by Dr. Verena   Discussed the use of AI scribe software for clinical note transcription with the patient, who gave verbal consent to proceed.  History of Present Illness   Symantha Leiner is a 73 year old female who presents with left ear bleeding and hearing issues after using a Q-tip.  Two Saturdays ago, she experienced immediate bleeding from her left ear after inserting a Q-tip too far. Prior to this, she had been experiencing hearing blockage in the left ear, which is usually better than her right ear in terms of hearing aid use. To address the blockage, she sat under warm water in the shower and then applied hydrogen peroxide to the ear before using the Q-tip.  She describes her ears as typically very dry and mentions using a sweet oil recommended by a hearing place every other day to manage this. She usually does not have wax buildup. Following the incident, she experienced pulsing pain and itching in the ear, which has persisted for about a week and a half. Her hearing was affected, with sounds popping in and out, especially when moving her jaw.  No ear drainage is reported.           Independent Review of Additional Tests or Records:  none   PMH/Meds/All/SocHx/FamHx/ROS:   Past Medical History:  Diagnosis Date   Cancer (HCC)    suspicous skin cells   Chest pain    Depression    Headache    mirgaines   History of kidney stones    Sinusitis      Past Surgical History:  Procedure Laterality Date   BREAST BIOPSY     KNEE SURGERY     ROTATOR CUFF REPAIR      Family History  Problem Relation Age  of Onset   Heart attack Father 55   Heart murmur Sister        vale replacement   Migraines Sister      Social Connections: Not on file      Current Outpatient Medications:    ARTIFICIAL TEAR SOLUTION OP, Place 1 drop into both eyes daily as needed (dry eyes)., Disp: , Rfl:    Caraway Oil-Levomenthol (FDGARD PO), Take 2 tablets by mouth daily., Disp: , Rfl:    cetirizine  (ZYRTEC ) 10 MG tablet, Take 1 tablet (10 mg total) by mouth daily., Disp: 30 tablet, Rfl: 11   Cholecalciferol (VITAMIN D) 50 MCG (2000 UT) tablet, Take 2,000 Units by mouth daily., Disp: , Rfl:    fluticasone  (FLONASE ) 50 MCG/ACT nasal spray, Place 2 sprays into both nostrils daily., Disp: 16 g, Rfl: 6   ibuprofen (ADVIL) 200 MG tablet, Take 800 mg by mouth every 6 (six) hours as needed for moderate pain., Disp: , Rfl:    Menatetrenone (VITAMIN K2) 100 MCG TABS, Take 100 mcg by mouth daily., Disp: , Rfl:    Multiple Vitamins-Minerals (ALGAE BASED CALCIUM) TABS, Take 4 tablets by mouth at bedtime., Disp: , Rfl:    Peppermint Oil (IBGARD PO), Take 2 tablets by mouth every evening., Disp: , Rfl:    Probiotic Product (PROBIOTIC PO), Take 1 capsule by mouth 2 (  two) times daily., Disp: , Rfl:    rizatriptan (MAXALT-MLT) 10 MG disintegrating tablet, Take 10 mg by mouth as needed for migraine. May repeat in 2 hours if needed, Disp: , Rfl:    rosuvastatin (CRESTOR) 10 MG tablet, Take 10 mg by mouth daily., Disp: , Rfl:    sertraline (ZOLOFT) 50 MG tablet, Take 75 mg by mouth daily., Disp: , Rfl:    valACYclovir (VALTREX) 500 MG tablet, Take 500 mg by mouth 2 (two) times daily as needed (fever blisters)., Disp: , Rfl:    mupirocin ointment (BACTROBAN) 2 %, Apply 1 Application topically at bedtime. (Patient not taking: Reported on 05/25/2024), Disp: , Rfl:    Physical Exam:   BP 127/76   Pulse 83   SpO2 97%   Pertinent Findings  CN II-XII grossly intact Left TM with dried blood and cerumen, right TM intact with well  pneumatized middle ear spaces Anterior rhinoscopy: Septum midline; bilateral inferior turbinates with no hypertrophy No lesions of oral cavity/oropharynx; dentition wnl  No obviously palpable neck masses/lymphadenopathy/thyromegaly No respiratory distress or stridor       Seprately Identifiable Procedures:  Procedure: bilateral ear microscopy and cerumen removal using microscope (CPT [phone]) - Mod 25 Pre-procedure diagnosis: unilateral cerumen impaction left external auditory canal Post-procedure diagnosis: same Indication: bilateral cerumen impaction; given patient's otologic complaints and history as well as for improved and comprehensive examination of external ear and tympanic membrane, bilateral otologic examination using microscope was performed and impacted cerumen removed  Procedure: Patient was placed semi-recumbent. Both ear canals were examined using the microscope with findings above. Cerumen removed from the left external auditory canal using suction and currette with improvement in EAC examination and patency. Left: EAC was patent. TM was intact . Middle ear was aerated. Drainage: none Right: EAC was patent. TM was intact . Middle ear was aerated . Drainage: none Patient tolerated the procedure well.   Impression & Plans:  Darica Eggleton is a 73 y.o. female with the following   Assessment and Plan    Left ear cerumen impaction with minor canal trauma  - left ear cleaned, some minor canal trauma, no lesions - avoid placing anything in the ear - follow up in two months to ensure no underlying etiology once blood completely clears, sooner as needed    - f/u 2 months    Thank you for allowing me the opportunity to care for your patient. Please do not hesitate to contact me should you have any other questions.  Sincerely, Chyrl Cohen PA-C [facility] Phone: [phone] Fax: [phone]  05/25/2024, 4:07 PM

## 2024-05-25 NOTE — Telephone Encounter (Signed)
 Called and spoke to patient to see if she could come in early because we had a cancellation. Patient said that she could be here by 3:00 pm.

## 2024-06-28 ENCOUNTER — Encounter: Payer: Self-pay | Admitting: Cardiology

## 2024-06-28 ENCOUNTER — Ambulatory Visit: Attending: Cardiology | Admitting: Cardiology

## 2024-06-28 VITALS — BP 128/70 | HR 81 | Ht 63.5 in | Wt 125.0 lb

## 2024-06-28 DIAGNOSIS — I2510 Atherosclerotic heart disease of native coronary artery without angina pectoris: Secondary | ICD-10-CM | POA: Diagnosis not present

## 2024-06-28 NOTE — Patient Instructions (Addendum)

## 2024-06-28 NOTE — Progress Notes (Signed)
 " Cardiology Office Note:    Date:  06/28/2024   ID:  Mindy Mann, DOB 07/29/1950, MRN [id number]  PCP:  Espinoza, Alejandra, DO   [facility] Providers Cardiologist:  None     Referring MD: Leva Rush, MD   Chief Complaint  Patient presents with   Coronary Artery Disease    History of Present Illness:    Mindy Mann is a 74 y.o. female is seen at the request of Dr Athena for evaluation of coronary artery calcification. She has a history of prior tobacco abuse and family history of premature CAD. Recent coronary calcium score was 2.38- 33rd percentile for age/sex. in 2020 score was 0 (seen by Dr Shlomo then).   She reports she quit smoking in 2008. Father had MI age 58 but was a heavy smoker. She recently was started on statin therapy. She is a horticulturist, commercial and very active. She eats healthy. She denies any chest pain or significant SOB. Generally feels she is in good  health.   Past Medical History:  Diagnosis Date   Cancer (HCC)    suspicous skin cells   Chest pain    Depression    Headache    mirgaines   History of kidney stones    Sinusitis     Past Surgical History:  Procedure Laterality Date   BREAST BIOPSY     KNEE SURGERY     ROTATOR CUFF REPAIR      Current Medications: Active Medications[1]   Allergies:   Patient has no known allergies.   Social History   Socioeconomic History   Marital status: Widowed    Spouse name: Not on file   Number of children: Not on file   Years of education: Not on file   Highest education level: Not on file  Occupational History   Not on file  Tobacco Use   Smoking status: Former    Current packs/day: 0.00    Types: Cigarettes    Quit date: 2018    Years since quitting: 8.0   Smokeless tobacco: Never  Vaping Use   Vaping status: Never Used  Substance and Sexual Activity   Alcohol use: Yes    Alcohol/week: 14.0 standard drinks of alcohol    Types: 14 Glasses of wine per week   Drug use: No    Sexual activity: Not on file  Other Topics Concern   Not on file  Social History Narrative   Pt lives alone    Retired    Social Drivers of Health   Tobacco Use: Medium Risk (06/28/2024)   Patient History    Smoking Tobacco Use: Former    Smokeless Tobacco Use: Never    Passive Exposure: Not on Actuary Strain: Not on file  Food Insecurity: Not on file  Transportation Needs: Not on file  Physical Activity: Not on file  Stress: Not on file  Social Connections: Not on file  Depression (EYV7-0): Not on file  Alcohol Screen: Not on file  Housing: Not on file  Utilities: Not on file  Health Literacy: Not on file     Family History: The patient's family history includes Heart attack (age of onset: 58) in her father; Heart murmur in her sister; Migraines in her sister.  ROS:   Please see the history of present illness.     All other systems reviewed and are negative.  EKGs/Labs/Other Studies Reviewed:    The following studies were reviewed today: EKG Interpretation Date/Time:  Thursday June 28 2024 11:45:54 EST Ventricular Rate:  81 PR Interval:  148 QRS Duration:  74 QT Interval:  346 QTC Calculation: 401 R Axis:   42  Text Interpretation: Normal sinus rhythm Normal ECG No previous ECGs available Confirmed by Adelynn Gipe [phone]) on 06/28/2024 11:49:02 AM    Recent Labs: No results found for requested labs within last 365 days.  Recent Lipid Panel No results found for: CHOL, TRIG, HDL, CHOLHDL, VLDL, LDLCALC, LDLDIRECT   Risk Assessment/Calculations:                Physical Exam:    VS:  BP 128/70 (BP Location: Left Arm, Patient Position: Sitting, Cuff Size: Normal)   Pulse 81   Ht 5' 3.5 (1.613 m)   Wt 125 lb (56.7 kg)   SpO2 95%   BMI 21.80 kg/m     Wt Readings from Last 3 Encounters:  06/28/24 125 lb (56.7 kg)  01/25/24 123 lb (55.8 kg)  07/12/23 128 lb (58.1 kg)     GEN:  Well nourished, well developed in no  acute distress HEENT: Normal NECK: No JVD; No carotid bruits LYMPHATICS: No lymphadenopathy CARDIAC: RRR, no murmurs, rubs, gallops RESPIRATORY:  Clear to auscultation without rales, wheezing or rhonchi  ABDOMEN: Soft, non-tender, non-distended MUSCULOSKELETAL:  No edema; No deformity  SKIN: Warm and dry NEUROLOGIC:  Alert and oriented x 3 PSYCHIATRIC:  Normal affect   ASSESSMENT:    1. Coronary artery calcification    PLAN:    In order of problems listed above:  Coronary artery calcification. Very mild. Also mild aortic atherosclerosis. She is asymptomatic. No further CV work up required. Agree with statin to lower risk. Goal LDL < 70. Discussed letting us  know if she does develop chest pain or unusual dyspnea in the future. Continue healthy lifestyle. She was concerned that treatment for her osteoporosis may have contributed to calcification in her arteries but I don't think that is the case. Now that some calcification has been identified I don't think serial scans would be of any benefit. Will follow up PRN           Medication Adjustments/Labs and Tests Ordered: Current medicines are reviewed at length with the patient today.  Concerns regarding medicines are outlined above.  Orders Placed This Encounter  Procedures   EKG 12-Lead   No orders of the defined types were placed in this encounter.   Patient Instructions  Medication Instructions:  Continue same medications  Lab Work: None ordered   Testing/Procedures: None ordered  Follow-Up: At [facility], you and your health needs are our priority.  As part of our continuing mission to provide you with exceptional heart care, our providers are all part of one team.  This team includes your primary Cardiologist (physician) and Advanced Practice Providers or APPs (Physician Assistants and Nurse Practitioners) who all work together to provide you with the care you need, when you need it.  Your next  appointment:  As Needed    Provider:  Dr.Markeese Boyajian    We recommend signing up for the patient portal called MyChart.  Sign up information is provided on this After Visit Summary.  MyChart is used to connect with patients for Virtual Visits (Telemedicine).  Patients are able to view lab/test results, encounter notes, upcoming appointments, etc.  Non-urgent messages can be sent to your provider as well.   To learn more about what you can do with MyChart, go to [email or web address].  Signed, Musa Rewerts, MD  06/28/2024 12:12 PM    [facility]     [1]  Current Meds  Medication Sig   ARTIFICIAL TEAR SOLUTION OP Place 1 drop into both eyes daily as needed (dry eyes).   Caraway Oil-Levomenthol (FDGARD PO) Take 2 tablets by mouth daily.   cetirizine  (ZYRTEC ) 10 MG tablet Take 1 tablet (10 mg total) by mouth daily.   Cholecalciferol (VITAMIN D) 50 MCG (2000 UT) tablet Take 2,000 Units by mouth daily.   fluticasone  (FLONASE ) 50 MCG/ACT nasal spray Place 2 sprays into both nostrils daily.   ibuprofen (ADVIL) 200 MG tablet Take 800 mg by mouth every 6 (six) hours as needed for moderate pain.   Menatetrenone (VITAMIN K2) 100 MCG TABS Take 100 mcg by mouth daily.   Multiple Vitamins-Minerals (ALGAE BASED CALCIUM) TABS Take 4 tablets by mouth at bedtime.   mupirocin ointment (BACTROBAN) 2 % Apply 1 Application topically at bedtime.   Peppermint Oil (IBGARD PO) Take 2 tablets by mouth every evening.   Probiotic Product (PROBIOTIC PO) Take 1 capsule by mouth 2 (two) times daily.   rizatriptan (MAXALT-MLT) 10 MG disintegrating tablet Take 10 mg by mouth as needed for migraine. May repeat in 2 hours if needed   rosuvastatin (CRESTOR) 10 MG tablet Take 10 mg by mouth daily.   sertraline (ZOLOFT) 50 MG tablet Take 75 mg by mouth daily.   valACYclovir (VALTREX) 500 MG tablet Take 500 mg by mouth 2 (two) times daily as needed (fever blisters).   "

## 2024-07-26 ENCOUNTER — Ambulatory Visit (INDEPENDENT_AMBULATORY_CARE_PROVIDER_SITE_OTHER): Admitting: Physician Assistant

## 2024-07-26 ENCOUNTER — Encounter (INDEPENDENT_AMBULATORY_CARE_PROVIDER_SITE_OTHER): Payer: Self-pay | Admitting: Physician Assistant

## 2024-07-26 VITALS — BP 133/85 | HR 77 | Temp 97.8°F

## 2024-07-26 DIAGNOSIS — H9222 Otorrhagia, left ear: Secondary | ICD-10-CM

## 2024-07-26 NOTE — Progress Notes (Signed)
 Dear Dr. Chet, Here is my assessment for our mutual patient, Mindy Mann. Thank you for allowing me the opportunity to care for your patient. Please do not hesitate to contact me should you have any other questions. Sincerely, Chyrl Cohen PA-C  Otolaryngology Clinic Note Referring provider: Dr. Chet HPI:  Mindy Mann is a 74 y.o. female kindly referred by Dr. Chet   Discussed the use of AI scribe software for clinical note transcription with the patient, who gave verbal consent to proceed.  History of Present Illness   Mindy Mann is a 74 year old female seen for follow-up evaluation.  She was last seen in the office on 05/25/2024.  Below is recap of the encounter.  Mindy Mann is a 74 year old female who presents with left ear bleeding and hearing issues after using a Q-tip.   Two Saturdays ago, she experienced immediate bleeding from her left ear after inserting a Q-tip too far. Prior to this, she had been experiencing hearing blockage in the left ear, which is usually better than her right ear in terms of hearing aid use. To address the blockage, she sat under warm water in the shower and then applied hydrogen peroxide to the ear before using the Q-tip.   She describes her ears as typically very dry and mentions using a sweet oil recommended by a hearing place every other day to manage this. She usually does not have wax buildup. Following the incident, she experienced pulsing pain and itching in the ear, which has persisted for about a week and a half. Her hearing was affected, with sounds popping in and out, especially when moving her jaw.   No ear drainage is reported.  Update 07/26/2024  She reports mild soreness in the left ear with intermittent popping sensations, particularly when manipulating the ear. She denies otalgia, otorrhea, or hearing loss.  She has used ear oil approximately three times since her last visit, occasionally applying up to ten  drops per use. She suspects that excessive ear oil use may have contributed to increased cerumen accumulation and has recently reduced the amount used.  She performs daily Q-tip use for ear cleaning and is aware of the associated risks. She states she was more cautious with Q-tip use prior to this visit.           Independent Review of Additional Tests or Records:  None   PMH/Meds/All/SocHx/FamHx/ROS:   Past Medical History:  Diagnosis Date   Cancer (HCC)    suspicous skin cells   Chest pain    Depression    Headache    mirgaines   History of kidney stones    Sinusitis      Past Surgical History:  Procedure Laterality Date   BREAST BIOPSY     KNEE SURGERY     ROTATOR CUFF REPAIR      Family History  Problem Relation Age of Onset   Heart attack Father 44   Heart murmur Sister        vale replacement   Migraines Sister      Social Connections: Not on file     Current Medications[1]   Physical Exam:   BP 133/85   Pulse 77   Temp 97.8 F (36.6 C)   SpO2 97%   Pertinent Findings  CN II-XII grossly intact Bilateral EAC clear and TM intact with well pneumatized middle ear spaces No obviously  neck masses/lymphadenopathy/thyromegaly No respiratory distress or stridor   Seprately Identifiable Procedures:  None  Impression & Plans:  Mindy Mann is a 74 y.o. female with the following   Assessment and Plan Doing well today, no suspicious lesions within the canal.  Okay to follow-up on a as needed basis.  Patient verbalized understanding and agreement to today's plan.       - f/u RN   Thank you for allowing me the opportunity to care for your patient. Please do not hesitate to contact me should you have any other questions.  Sincerely, Chyrl Cohen PA-C  [facility] Phone: [phone] Fax: [phone]  07/26/2024, 3:35 PM        [1]  Current Outpatient Medications:    ARTIFICIAL TEAR SOLUTION OP, Place 1 drop into both  eyes daily as needed (dry eyes)., Disp: , Rfl:    Caraway Oil-Levomenthol (FDGARD PO), Take 2 tablets by mouth daily., Disp: , Rfl:    cetirizine  (ZYRTEC ) 10 MG tablet, Take 1 tablet (10 mg total) by mouth daily., Disp: 30 tablet, Rfl: 11   Cholecalciferol (VITAMIN D) 50 MCG (2000 UT) tablet, Take 2,000 Units by mouth daily., Disp: , Rfl:    fluticasone  (FLONASE ) 50 MCG/ACT nasal spray, Place 2 sprays into both nostrils daily., Disp: 16 g, Rfl: 6   ibuprofen (ADVIL) 200 MG tablet, Take 800 mg by mouth every 6 (six) hours as needed for moderate pain., Disp: , Rfl:    Menatetrenone (VITAMIN K2) 100 MCG TABS, Take 100 mcg by mouth daily., Disp: , Rfl:    Multiple Vitamins-Minerals (ALGAE BASED CALCIUM) TABS, Take 4 tablets by mouth at bedtime., Disp: , Rfl:    mupirocin ointment (BACTROBAN) 2 %, Apply 1 Application topically at bedtime., Disp: , Rfl:    Peppermint Oil (IBGARD PO), Take 2 tablets by mouth every evening., Disp: , Rfl:    Probiotic Product (PROBIOTIC PO), Take 1 capsule by mouth 2 (two) times daily., Disp: , Rfl:    rizatriptan (MAXALT-MLT) 10 MG disintegrating tablet, Take 10 mg by mouth as needed for migraine. May repeat in 2 hours if needed, Disp: , Rfl:    rosuvastatin (CRESTOR) 10 MG tablet, Take 10 mg by mouth daily., Disp: , Rfl:    sertraline (ZOLOFT) 50 MG tablet, Take 75 mg by mouth daily., Disp: , Rfl:    valACYclovir (VALTREX) 500 MG tablet, Take 500 mg by mouth 2 (two) times daily as needed (fever blisters)., Disp: , Rfl:
